# Patient Record
Sex: Female | Born: 1935 | Race: White | Hispanic: No | Smoking: Never smoker
Health system: Southern US, Community
[De-identification: ages and names within clinical notes are randomized; demographics above are authoritative.]

## PROBLEM LIST (undated history)

## (undated) DIAGNOSIS — I639 Cerebral infarction, unspecified: Secondary | ICD-10-CM

## (undated) DIAGNOSIS — E7800 Pure hypercholesterolemia, unspecified: Secondary | ICD-10-CM

## (undated) DIAGNOSIS — I10 Essential (primary) hypertension: Secondary | ICD-10-CM

## (undated) HISTORY — PX: HIP SURGERY: SHX245

## (undated) HISTORY — PX: ABDOMINAL HYSTERECTOMY: SHX81

---

## 2003-03-29 ENCOUNTER — Inpatient Hospital Stay (HOSPITAL_COMMUNITY)
Admission: RE | Admit: 2003-03-29 | Discharge: 2003-04-05 | Payer: Self-pay | Admitting: Physical Medicine & Rehabilitation

## 2015-07-01 ENCOUNTER — Emergency Department: Payer: Medicare Other

## 2015-07-01 ENCOUNTER — Inpatient Hospital Stay
Admission: EM | Admit: 2015-07-01 | Discharge: 2015-07-04 | DRG: 510 | Disposition: A | Discharge status: Skilled Nursing Facility | Payer: Medicare Other | Attending: Internal Medicine | Admitting: Internal Medicine

## 2015-07-01 ENCOUNTER — Encounter: Payer: Self-pay | Admitting: Emergency Medicine

## 2015-07-01 DIAGNOSIS — S52602A Unspecified fracture of lower end of left ulna, initial encounter for closed fracture: Secondary | ICD-10-CM | POA: Diagnosis present

## 2015-07-01 DIAGNOSIS — S72002A Fracture of unspecified part of neck of left femur, initial encounter for closed fracture: Secondary | ICD-10-CM

## 2015-07-01 DIAGNOSIS — E785 Hyperlipidemia, unspecified: Secondary | ICD-10-CM | POA: Diagnosis present

## 2015-07-01 DIAGNOSIS — S72142A Displaced intertrochanteric fracture of left femur, initial encounter for closed fracture: Secondary | ICD-10-CM | POA: Diagnosis present

## 2015-07-01 DIAGNOSIS — S52502A Unspecified fracture of the lower end of left radius, initial encounter for closed fracture: Secondary | ICD-10-CM | POA: Diagnosis present

## 2015-07-01 DIAGNOSIS — W19XXXA Unspecified fall, initial encounter: Secondary | ICD-10-CM | POA: Diagnosis present

## 2015-07-01 DIAGNOSIS — Z9889 Other specified postprocedural states: Secondary | ICD-10-CM

## 2015-07-01 DIAGNOSIS — I10 Essential (primary) hypertension: Secondary | ICD-10-CM | POA: Diagnosis present

## 2015-07-01 DIAGNOSIS — N390 Urinary tract infection, site not specified: Secondary | ICD-10-CM | POA: Diagnosis present

## 2015-07-01 DIAGNOSIS — E7800 Pure hypercholesterolemia, unspecified: Secondary | ICD-10-CM | POA: Diagnosis present

## 2015-07-01 DIAGNOSIS — Z886 Allergy status to analgesic agent status: Secondary | ICD-10-CM

## 2015-07-01 DIAGNOSIS — R52 Pain, unspecified: Secondary | ICD-10-CM | POA: Diagnosis present

## 2015-07-01 DIAGNOSIS — Z66 Do not resuscitate: Secondary | ICD-10-CM | POA: Diagnosis present

## 2015-07-01 DIAGNOSIS — Z7982 Long term (current) use of aspirin: Secondary | ICD-10-CM

## 2015-07-01 DIAGNOSIS — Z79899 Other long term (current) drug therapy: Secondary | ICD-10-CM | POA: Diagnosis not present

## 2015-07-01 DIAGNOSIS — Z809 Family history of malignant neoplasm, unspecified: Secondary | ICD-10-CM

## 2015-07-01 DIAGNOSIS — Z8673 Personal history of transient ischemic attack (TIA), and cerebral infarction without residual deficits: Secondary | ICD-10-CM

## 2015-07-01 DIAGNOSIS — Z9181 History of falling: Secondary | ICD-10-CM | POA: Diagnosis not present

## 2015-07-01 DIAGNOSIS — H409 Unspecified glaucoma: Secondary | ICD-10-CM | POA: Diagnosis present

## 2015-07-01 DIAGNOSIS — B961 Klebsiella pneumoniae [K. pneumoniae] as the cause of diseases classified elsewhere: Secondary | ICD-10-CM | POA: Diagnosis present

## 2015-07-01 DIAGNOSIS — Z90710 Acquired absence of both cervix and uterus: Secondary | ICD-10-CM | POA: Diagnosis not present

## 2015-07-01 DIAGNOSIS — I482 Chronic atrial fibrillation: Secondary | ICD-10-CM | POA: Diagnosis present

## 2015-07-01 DIAGNOSIS — S72009A Fracture of unspecified part of neck of unspecified femur, initial encounter for closed fracture: Secondary | ICD-10-CM | POA: Diagnosis present

## 2015-07-01 HISTORY — DX: Cerebral infarction, unspecified: I63.9

## 2015-07-01 HISTORY — DX: Pure hypercholesterolemia, unspecified: E78.00

## 2015-07-01 HISTORY — DX: Essential (primary) hypertension: I10

## 2015-07-01 LAB — URINALYSIS COMPLETE WITH MICROSCOPIC (ARMC ONLY)
BACTERIA UA: NONE SEEN
BILIRUBIN URINE: NEGATIVE
GLUCOSE, UA: NEGATIVE mg/dL
HGB URINE DIPSTICK: NEGATIVE
LEUKOCYTES UA: NEGATIVE
NITRITE: NEGATIVE
PH: 5 (ref 5.0–8.0)
RBC / HPF: NONE SEEN RBC/hpf (ref 0–5)
SPECIFIC GRAVITY, URINE: 1.02 (ref 1.005–1.030)
Squamous Epithelial / LPF: NONE SEEN
WBC, UA: NONE SEEN WBC/hpf (ref 0–5)

## 2015-07-01 LAB — COMPREHENSIVE METABOLIC PANEL
ALT: 20 U/L (ref 14–54)
AST: 28 U/L (ref 15–41)
Albumin: 3.2 g/dL — ABNORMAL LOW (ref 3.5–5.0)
Alkaline Phosphatase: 68 U/L (ref 38–126)
Anion gap: 8 (ref 5–15)
BILIRUBIN TOTAL: 1 mg/dL (ref 0.3–1.2)
BUN: 29 mg/dL — ABNORMAL HIGH (ref 6–20)
CHLORIDE: 108 mmol/L (ref 101–111)
CO2: 24 mmol/L (ref 22–32)
CREATININE: 1.06 mg/dL — AB (ref 0.44–1.00)
Calcium: 8.8 mg/dL — ABNORMAL LOW (ref 8.9–10.3)
GFR, EST AFRICAN AMERICAN: 52 mL/min — AB (ref >60)
GFR, EST NON AFRICAN AMERICAN: 44 mL/min — AB (ref >60)
Glucose, Bld: 144 mg/dL — ABNORMAL HIGH (ref 65–99)
POTASSIUM: 3.8 mmol/L (ref 3.5–5.1)
Sodium: 140 mmol/L (ref 135–145)
TOTAL PROTEIN: 6.2 g/dL — AB (ref 6.5–8.1)

## 2015-07-01 LAB — CBC WITH DIFFERENTIAL/PLATELET
BASOS ABS: 0 10*3/uL (ref 0–0.1)
BASOS PCT: 0 %
EOS PCT: 0 %
Eosinophils Absolute: 0 10*3/uL (ref 0–0.7)
HCT: 33.2 % — ABNORMAL LOW (ref 35.0–47.0)
Hemoglobin: 11 g/dL — ABNORMAL LOW (ref 12.0–16.0)
LYMPHS PCT: 9 %
Lymphs Abs: 0.7 10*3/uL — ABNORMAL LOW (ref 1.0–3.6)
MCH: 30.2 pg (ref 26.0–34.0)
MCHC: 33 g/dL (ref 32.0–36.0)
MCV: 91.5 fL (ref 80.0–100.0)
MONO ABS: 1 10*3/uL — AB (ref 0.2–0.9)
Monocytes Relative: 13 %
Neutro Abs: 5.9 10*3/uL (ref 1.4–6.5)
Neutrophils Relative %: 78 %
PLATELETS: 135 10*3/uL — AB (ref 150–440)
RBC: 3.63 MIL/uL — AB (ref 3.80–5.20)
RDW: 14.9 % — AB (ref 11.5–14.5)
WBC: 7.6 10*3/uL (ref 3.6–11.0)

## 2015-07-01 LAB — PROTIME-INR
INR: 1.13
Prothrombin Time: 14.7 seconds (ref 11.4–15.0)

## 2015-07-01 LAB — ABO/RH: ABO/RH(D): A POS

## 2015-07-01 LAB — TYPE AND SCREEN
ABO/RH(D): A POS
ANTIBODY SCREEN: NEGATIVE

## 2015-07-01 LAB — TROPONIN I: TROPONIN I: 0.03 ng/mL (ref <0.031)

## 2015-07-01 LAB — TSH: TSH: 3.044 u[IU]/mL (ref 0.350–4.500)

## 2015-07-01 MED ORDER — HYDROCODONE-ACETAMINOPHEN 5-325 MG PO TABS: 1.0000 | ORAL_TABLET | ORAL | Status: DC | PRN

## 2015-07-01 MED ORDER — MORPHINE SULFATE (PF) 2 MG/ML IV SOLN
0.5000 mg | INTRAVENOUS | Status: DC | PRN
  Administered 2015-07-01 – 2015-07-02 (×3): 0.5 mg via INTRAVENOUS
  Filled 2015-07-01 (×3): qty 1

## 2015-07-01 MED ORDER — EZETIMIBE 10 MG PO TABS
10.0000 mg | ORAL_TABLET | Freq: Every day | ORAL | Status: DC
  Administered 2015-07-01: 10 mg via ORAL
  Filled 2015-07-01: qty 1

## 2015-07-01 MED ORDER — ADULT MULTIVITAMIN W/MINERALS CH
1.0000 | ORAL_TABLET | Freq: Every day | ORAL | Status: DC
  Administered 2015-07-01: 1 via ORAL
  Filled 2015-07-01: qty 1

## 2015-07-01 MED ORDER — FERROUS SULFATE 325 (65 FE) MG PO TABS: 325.0000 mg | ORAL_TABLET | Freq: Every day | ORAL | Status: DC

## 2015-07-01 MED ORDER — SODIUM CHLORIDE 0.45 % IV SOLN: INTRAVENOUS | Status: DC

## 2015-07-01 MED ORDER — LISINOPRIL 20 MG PO TABS
20.0000 mg | ORAL_TABLET | Freq: Every day | ORAL | Status: DC
  Administered 2015-07-01: 20 mg via ORAL
  Filled 2015-07-01: qty 1

## 2015-07-01 MED ORDER — SODIUM CHLORIDE 0.9 % IV SOLN
INTRAVENOUS | Status: DC
  Administered 2015-07-01 – 2015-07-02 (×2): via INTRAVENOUS

## 2015-07-01 MED ORDER — HYDRALAZINE HCL 20 MG/ML IJ SOLN
10.0000 mg | INTRAMUSCULAR | Status: DC | PRN
  Administered 2015-07-01: 10 mg via INTRAVENOUS
  Filled 2015-07-01: qty 1

## 2015-07-01 MED ORDER — CEFAZOLIN SODIUM-DEXTROSE 2-3 GM-% IV SOLR
2.0000 g | INTRAVENOUS | Status: DC
  Filled 2015-07-01 (×2): qty 50

## 2015-07-01 MED ORDER — CILOSTAZOL 100 MG PO TABS
100.0000 mg | ORAL_TABLET | Freq: Two times a day (BID) | ORAL | Status: DC
  Administered 2015-07-01: 100 mg via ORAL
  Filled 2015-07-01 (×3): qty 1

## 2015-07-01 MED ORDER — METOPROLOL SUCCINATE ER 25 MG PO TB24
25.0000 mg | ORAL_TABLET | Freq: Every day | ORAL | Status: DC
  Administered 2015-07-01 – 2015-07-02 (×2): 25 mg via ORAL
  Filled 2015-07-01 (×2): qty 1

## 2015-07-01 MED ORDER — DORZOLAMIDE HCL 2 % OP SOLN
1.0000 [drp] | Freq: Two times a day (BID) | OPHTHALMIC | Status: DC
  Administered 2015-07-01 – 2015-07-02 (×2): 1 [drp] via OPHTHALMIC
  Filled 2015-07-01: qty 10

## 2015-07-01 MED ORDER — DOCUSATE SODIUM 100 MG PO CAPS
100.0000 mg | ORAL_CAPSULE | Freq: Two times a day (BID) | ORAL | Status: DC
  Administered 2015-07-01: 100 mg via ORAL
  Filled 2015-07-01: qty 1

## 2015-07-01 MED ORDER — HYDROCODONE-ACETAMINOPHEN 5-325 MG PO TABS
1.0000 | ORAL_TABLET | Freq: Four times a day (QID) | ORAL | Status: DC | PRN
  Administered 2015-07-01 – 2015-07-02 (×2): 2 via ORAL
  Filled 2015-07-01 (×2): qty 2

## 2015-07-01 MED ORDER — SENNOSIDES-DOCUSATE SODIUM 8.6-50 MG PO TABS: 1.0000 | ORAL_TABLET | Freq: Every evening | ORAL | Status: DC | PRN

## 2015-07-01 NOTE — ED Provider Notes (Signed)
[facility] Emergency Department Provider Note  ____________________________________________  Time seen: Approximately 2:39 PM  I have reviewed the triage vital signs and the nursing notes.   HISTORY  Chief Complaint Fall    HPI Cathy Harvey is a 82 y.o. female patient fell and fractured her wrist was seen at another hospital to follow-up with orthopedics but fell again today and landed on the wrist and the left hip and cannot bear weight now. Patient has no other injuries. Pain is mild if she doesn't move she does not need any pain medicine she says at present.   Past Medical History  Diagnosis Date  . Stroke (HCC)   . Hypertension   . Hypercholesteremia     There are no active problems to display for this patient.   Past Surgical History  Procedure Laterality Date  . Abdominal hysterectomy    . Hip surgery      No current outpatient prescriptions on file.  Allergies Codeine  History reviewed. No pertinent family history.  Social History Social History  Substance Use Topics  . Smoking status: Never Smoker   . Smokeless tobacco: None  . Alcohol Use: No    Review of Systems Constitutional: No fever/chills Eyes: No visual changes. ENT: No sore throat. Cardiovascular: Denies chest pain. Respiratory: Denies shortness of breath. Gastrointestinal: No abdominal pain.  No nausea, no vomiting.  No diarrhea.  No constipation. Genitourinary: Negative for dysuria. Musculoskeletal: Negative for back pain. Skin: Negative for rash. Neurological: Negative for headaches, focal weakness or numbness.  10-point ROS otherwise negative.  ____________________________________________   PHYSICAL EXAM:  VITAL SIGNS: ED Triage Vitals  Enc Vitals Group     BP 07/01/15 1244 176/70 mmHg     Pulse Rate 07/01/15 1244 70     Resp 07/01/15 1244 18     Temp --      Temp src --      SpO2 07/01/15 1244 99 %     Weight 07/01/15 1244 125 lb  (56.7 kg)     Height 07/01/15 1244  (1.676 m)     Head Cir --      Peak Flow --      Pain Score 07/01/15 1245 8     Pain Loc --      Pain Edu? --      Excl. in GC? --     Constitutional: Alert and oriented. Well appearing and in no acute distress. Eyes: Conjunctivae are normal. PERRL. EOMI. Head: Atraumatic. Nose: No congestion/rhinnorhea. Mouth/Throat: Mucous membranes are moist.  Oropharynx non-erythematous. Neck: No stridor. Cardiovascular: Normal rate, regular rhythm. Grossly normal heart sounds.  Good peripheral circulation. Respiratory: Normal respiratory effort.  No retractions. Lungs CTAB. Gastrointestinal: Soft and nontender. No distention. No abdominal bruits. No CVA tenderness. Musculoskeletal: No lower extremity tenderness nor edema.  No joint effusions. Neurologic:  Normal speech and language. No gross focal neurologic deficits are appreciated. No gait instability. Skin:  Skin is warm, dry and intact. No rash noted. Psychiatric: Mood and affect are normal. Speech and behavior are normal.  ____________________________________________   LABS (all labs ordered are listed, but only abnormal results are displayed)  Labs Reviewed  COMPREHENSIVE METABOLIC PANEL - Abnormal; Notable for the following:    Glucose, Bld 144 (*)    BUN 29 (*)    Creatinine, Ser 1.06 (*)    Calcium 8.8 (*)    Total Protein 6.2 (*)    Albumin 3.2 (*)    GFR  calc non Af Amer 44 (*)    GFR calc Af Amer 52 (*)    All other components within normal limits  CBC WITH DIFFERENTIAL/PLATELET - Abnormal; Notable for the following:    RBC 3.63 (*)    Hemoglobin 11.0 (*)    HCT 33.2 (*)    RDW 14.9 (*)    Platelets 135 (*)    Lymphs Abs 0.7 (*)    Monocytes Absolute 1.0 (*)    All other components within normal limits  TROPONIN I  TSH   ____________________________________________  EKG   ____________________________________________  RADIOLOGY  Wrist shows a markedly displaced  fracture which is apparently unchanged from previous x-ray MRI of the hip shows a non-displaced into incomplete intertrochanteric fracture ____________________________________________   PROCEDURES    ____________________________________________   INITIAL IMPRESSION / ASSESSMENT AND PLAN / ED COURSE  Pertinent labs & imaging results that were available during my care of the patient were reviewed by me and considered in my medical decision making (see chart for details).  Discussed with Dr. Hyacinth Meeker he will follow-up plan patient for surgery tomorrow ____________________________________________   FINAL CLINICAL IMPRESSION(S) / ED DIAGNOSES  Final diagnoses:  Pain  Hip fracture, left, closed, initial encounter [facility])      Arnaldo Natal, MD 07/01/15 1630

## 2015-07-01 NOTE — ED Notes (Signed)
Patient transported to X-ray 

## 2015-07-01 NOTE — ED Notes (Addendum)
Pt fell and fx left wrist on Monday.  Pt was sitting on stool and slid off today landing on left arm with fx and left hip. C/o soreness to left hip; daughter in law reports unable to bear weight on that leg. Unable to tell if rotation/shortening in wheelchair. Pt denies dizziness; was mechanical fall; reports just slipped off stool. Did not hit head or black out. Left hand has some swelling and redness/purple color; pt reports this is not new; hand is warm to touch.

## 2015-07-01 NOTE — H&P (Signed)
[facility] - [place] [facility]   PATIENT NAME: Cathy Harvey    MR#:  [id number]  DATE OF BIRTH:  10/19/1921  DATE OF ADMISSION:  07/01/2015  PRIMARY CARE PHYSICIAN: [facility]   REQUESTING/REFERRING PHYSICIAN: Dr. Darnelle Catalan  CHIEF COMPLAINT:  Left hip pain status post fall at home today  HISTORY OF PRESENT ILLNESS:  Cathy Harvey  is a 82 y.o. female with a known history of chronic A. fib, hypertension, history of glaucoma comes to the emergency room accompanied by her daughter with a chief complaint. Patient was at a gas station on Thursday had sustained a fall developed left distal radial and ulnar fracture status post cast at [facility] emergency room. Patient went home had a mechanical fall today started having left hip pain and workup in the emergency room including MRI of the left hip shows subacute incomplete left intertrochanteric fracture. Patient is being admitted for further vaginal management Patient has known history of chronic A. fib not on any anticoagulation. No history of coronary artery disease per patient and daughter. Denies any chest pain shortness of breath. She is otherwise quite functional and active. She still drives.  PAST MEDICAL HISTORY:   Past Medical History  Diagnosis Date  . Stroke (HCC)   . Hypertension   . Hypercholesteremia     PAST SURGICAL HISTOIRY:   Past Surgical History  Procedure Laterality Date  . Abdominal hysterectomy    . Hip surgery      SOCIAL HISTORY:   Social History  Substance Use Topics  . Smoking status: Never Smoker   . Smokeless tobacco: Not on file  . Alcohol Use: No    FAMILY HISTORY:   Family History  Problem Relation Age of Onset  . Cancer Other     DRUG ALLERGIES:   Allergies  Allergen Reactions  . Codeine Other (See Comments)    Pt unaware of rxn. Patient is allergic to high doses of codeine.     REVIEW OF SYSTEMS:  Review of Systems   Constitutional: Negative for fever, chills and weight loss.  HENT: Negative for ear discharge, ear pain and nosebleeds.   Eyes: Negative for blurred vision, pain and discharge.  Respiratory: Negative for sputum production, shortness of breath, wheezing and stridor.   Cardiovascular: Negative for chest pain, palpitations, orthopnea and PND.  Gastrointestinal: Negative for nausea, vomiting, abdominal pain and diarrhea.  Genitourinary: Negative for urgency and frequency.  Musculoskeletal: Positive for joint pain and falls. Negative for back pain.  Neurological: Negative for sensory change, speech change, focal weakness and weakness.  Psychiatric/Behavioral: Negative for depression and hallucinations. The patient is not nervous/anxious.   All other systems reviewed and are negative.    MEDICATIONS AT HOME:   Prior to Admission medications   Medication Sig Start Date End Date Taking? Authorizing Provider  aspirin EC 81 MG tablet Take 81 mg by mouth daily.   Yes Historical Provider, MD  cilostazol (PLETAL) 100 MG tablet Take 100 mg by mouth 2 (two) times daily.   Yes Historical Provider, MD  dorzolamide (TRUSOPT) 2 % ophthalmic solution Place 1 drop into both eyes 2 (two) times daily.   Yes Historical Provider, MD  ezetimibe (ZETIA) 10 MG tablet Take 10 mg by mouth daily.   Yes Historical Provider, MD  Ferrous Sulfate (FEROSUL PO) Take 1 tablet by mouth daily.   Yes Historical Provider, MD  ferrous sulfate 325 (65 FE) MG tablet Take 325 mg by mouth daily with breakfast.  Yes Historical Provider, MD  HYDROcodone-acetaminophen (NORCO/VICODIN) 5-325 MG tablet Take 1 tablet by mouth every 4 (four) hours as needed for severe pain.   Yes Historical Provider, MD  lisinopril (PRINIVIL,ZESTRIL) 20 MG tablet Take 20 mg by mouth daily.   Yes Historical Provider, MD  metoprolol succinate (TOPROL-XL) 25 MG 24 hr tablet Take 25 mg by mouth daily.   Yes Historical Provider, MD  Multiple Vitamins-Minerals  (CENTRUM SILVER PO) Take 1 tablet by mouth daily.   Yes Historical Provider, MD      VITAL SIGNS:  Blood pressure 193/77, pulse 87, resp. rate 17, height  (1.676 m), weight 56.7 kg (125 lb), SpO2 100 %.  PHYSICAL EXAMINATION:  GENERAL:  82 y.o.-year-old patient lying in the bed with no acute distress.  EYES: Pupils equal, round, reactive to light and accommodation. No scleral icterus. Extraocular muscles intact.  HEENT: Head atraumatic, normocephalic. Oropharynx and nasopharynx clear.  NECK:  Supple, no jugular venous distention. No thyroid enlargement, no tenderness.  LUNGS: Normal breath sounds bilaterally, no wheezing, rales,rhonchi or crepitation. No use of accessory muscles of respiration.  CARDIOVASCULAR: S1, S2 normal. No murmurs, rubs, or gallops.  ABDOMEN: Soft, nontender, nondistended. Bowel sounds present. No organomegaly or mass.  EXTREMITIES: No pedal edema, cyanosis, or clubbing. Left upper extremity cast. Left lower extremity hip pain and decreased range of motion. NEUROLOGIC: Cranial nerves II through XII are intact. Muscle strength 4/5 in all extremities. Sensation intact. Gait not checked.  PSYCHIATRIC: The patient is alert and oriented x 3.  SKIN: No obvious rash, lesion, or ulcer.   LABORATORY PANEL:   CBC  Recent Labs Lab 07/01/15 1454  WBC 7.6  HGB 11.0*  HCT 33.2*  PLT 135*   ------------------------------------------------------------------------------------------------------------------  Chemistries   Recent Labs Lab 07/01/15 1454  NA 140  K 3.8  CL 108  CO2 24  GLUCOSE 144*  BUN 29*  CREATININE 1.06*  CALCIUM 8.8*  AST 28  ALT 20  ALKPHOS 68  BILITOT 1.0   ------------------------------------------------------------------------------------------------------------------  Cardiac Enzymes  Recent Labs Lab 07/01/15 1454  TROPONINI 0.03    ------------------------------------------------------------------------------------------------------------------  RADIOLOGY:  Dg Wrist Complete Left  07/01/2015  CLINICAL DATA:  Per family pt fell last Friday 2/17, known left wrist fx. Pt fell again today, now c/o left lateral hip pain. EXAM: LEFT WRIST - COMPLETE 3+ VIEW COMPARISON:  06/29/2015 FINDINGS: Wrist is imaged through plaster splint material. There are transverse fractures of the distal radius and ulna associated with 1 shaft width posterior displacement and significant over riding of fracture fragments. Alignment appears unchanged. There is limited detail given the presence of plaster splinting material. IMPRESSION: Unchanged alignment of distal radius and ulnar fractures. Electronically Signed   By: Norva Pavlov M.D.   On: 07/01/2015 14:39   Mr Hip Left Wo Contrast  07/01/2015  CLINICAL DATA:  Status post fall this morning with a left hip injury. Pain. Initial encounter. EXAM: MR OF THE LEFT HIP WITHOUT CONTRAST TECHNIQUE: Multiplanar, multisequence MR imaging was performed. No intravenous contrast was administered. COMPARISON:  Plain films earlier today. FINDINGS: Bones: The patient has an acute or subacute incomplete intertrochanteric fracture. The fracture extends from the greater trochanter into the intertrochanteric femur but does not quite reach the lesser trochanter. No other fracture is identified. Artifact from right hip replacement is noted. A well-circumscribed cystic lesion in the proximal diaphysis of the femur measuring 0.6 cm has benign features. Articular cartilage and labrum Articular cartilage:  Mildly degenerated. Labrum:  Mildly  degenerated. Joint or bursal effusion Joint effusion:  No effusion. Bursae:  Unremarkable. Muscles and tendons Muscles and tendons: There is fluid is seen about the left gluteus medius attending compatible with strain. Short adductors also demonstrates some intrasubstance edema consistent  with strain. No frank tear is identified. Other findings Miscellaneous:  None. IMPRESSION: The study is positive for an acute for subacute incomplete left intertrochanteric fracture. Strain of the short adductors about the left hip and left gluteus medius without tear also noted. Electronically Signed   By: Drusilla Kanner M.D.   On: 07/01/2015 15:58   Dg Hip Unilat With Pelvis 2-3 Views Left  07/01/2015  CLINICAL DATA:  Fall with left hip pain. EXAM: DG HIP (WITH OR WITHOUT PELVIS) 2-3V LEFT COMPARISON:  12/11/2007 CT abdomen/ pelvis. FINDINGS: No fracture, dislocation or suspicious focal osseous lesion in the left hip. Partially visualized right hip hemiarthroplasty, with no evidence of hardware fracture or loosening. Degenerative changes in the visualized lower lumbar spine. Headache curvilinear calcifications in the left hip joint may indicate chondrocalcinosis. IMPRESSION: No fracture or malalignment in the left hip. Electronically Signed   By: Delbert Phenix M.D.   On: 07/01/2015 14:37    EKG:   A. fib st elevation or depression. No old EKG for comparison IMPRESSION AND PLAN:   Vena Bassinger  is a 82 y.o. female with a known history of chronic A. fib, hypertension, history of glaucoma comes to the emergency room accompanied by her daughter with a chief complaint. Patient was at a gas station on Thursday had sustained a fall developed left distal radial and ulnar fracture status post cast at [facility] emergency room. Patient went home had a mechanical fall today started having left hip pain and workup in the emergency room including MRI of the left hip shows subacute incomplete left intertrochanteric fracture.  1. Acute left intertrochanteric fracture status post mechanical fall -Admit to ortho floor -IV fluids -Orthopedic consultation -Patient is an intermediate risk for surgery. Okay to proceed -Continue beat blockers in the perioperative period -Resume aspirin after surgery -When  necessary pain meds -Incentive spirometer  2. DVT prophylaxis per Dr. Hyacinth Meeker SCDs and teds for now  3. Chronic A. fib heart rate stable -Continue metoprolol -Resume aspirin after surgery  4. Hyperlipidemia continue statins  5. Left distal ulnar and radial fracture per Dr. Hyacinth Meeker  Above was discussed with patient and patient's daughter present in the emergency room  All the records are reviewed and case discussed with ED provider. Management plans discussed with the patient, family and they are in agreement.  CODE STATUS: DO NOT RESUSCITATE. This was discussed with patient's daughter and patient  TOTAL TIME TAKING CARE OF THIS PATIENT: 50 minutes.    Efrat Zuidema M.D on 07/01/2015 at 5:20 PM  Between 7am to 6pm - Pager - [phone]  After 6pm go to [email or web address] - password [email or web address] [facility]  [facility]  Office  [phone]  CC: Primary care physician; [facility]

## 2015-07-01 NOTE — ED Notes (Signed)
Dr Darnelle Catalan notified pt in new onset afib on monitor. Will obtain labs and iv before pt goes to mri.

## 2015-07-02 ENCOUNTER — Inpatient Hospital Stay: Payer: Medicare Other | Admitting: Anesthesiology

## 2015-07-02 ENCOUNTER — Encounter: Payer: Self-pay | Admitting: Anesthesiology

## 2015-07-02 ENCOUNTER — Encounter
Admission: EM | Disposition: A | Discharge status: Skilled Nursing Facility | Payer: Self-pay | Source: Home / Self Care | Attending: Internal Medicine

## 2015-07-02 HISTORY — PX: OPEN REDUCTION INTERNAL FIXATION (ORIF) DISTAL RADIAL FRACTURE: SHX5989

## 2015-07-02 LAB — SURGICAL PCR SCREEN
MRSA, PCR: NEGATIVE
Staphylococcus aureus: NEGATIVE

## 2015-07-02 SURGERY — OPEN REDUCTION INTERNAL FIXATION (ORIF) DISTAL RADIUS FRACTURE
Anesthesia: General | Laterality: Left

## 2015-07-02 MED ORDER — SODIUM CHLORIDE 0.45 % IV SOLN
INTRAVENOUS | Status: DC
  Administered 2015-07-02 – 2015-07-03 (×2): via INTRAVENOUS

## 2015-07-02 MED ORDER — ONDANSETRON HCL 4 MG/2ML IJ SOLN: 4.0000 mg | Freq: Four times a day (QID) | INTRAMUSCULAR | Status: DC | PRN

## 2015-07-02 MED ORDER — METHOCARBAMOL 1000 MG/10ML IJ SOLN
500.0000 mg | Freq: Four times a day (QID) | INTRAVENOUS | Status: DC | PRN
  Filled 2015-07-02: qty 5

## 2015-07-02 MED ORDER — ALPRAZOLAM 0.5 MG PO TABS: 0.5000 mg | ORAL_TABLET | Freq: Four times a day (QID) | ORAL | Status: DC | PRN

## 2015-07-02 MED ORDER — LACTATED RINGERS IV SOLN
INTRAVENOUS | Status: DC
  Administered 2015-07-02 (×2): via INTRAVENOUS

## 2015-07-02 MED ORDER — ZOLPIDEM TARTRATE 5 MG PO TABS
5.0000 mg | ORAL_TABLET | Freq: Every evening | ORAL | Status: DC | PRN
Start: 2015-07-02 — End: 2015-07-04

## 2015-07-02 MED ORDER — NEOMYCIN-POLYMYXIN B GU 40-200000 IR SOLN
Status: AC
  Filled 2015-07-02: qty 2

## 2015-07-02 MED ORDER — ONDANSETRON HCL 4 MG/2ML IJ SOLN
INTRAMUSCULAR | Status: DC | PRN
  Administered 2015-07-02: 4 mg via INTRAVENOUS

## 2015-07-02 MED ORDER — HYDROCODONE-ACETAMINOPHEN 5-325 MG PO TABS: 1.0000 | ORAL_TABLET | ORAL | Status: DC | PRN

## 2015-07-02 MED ORDER — FENTANYL CITRATE (PF) 100 MCG/2ML IJ SOLN
25.0000 ug | INTRAMUSCULAR | Status: DC | PRN
  Administered 2015-07-02: 25 ug via INTRAVENOUS

## 2015-07-02 MED ORDER — FENTANYL CITRATE (PF) 100 MCG/2ML IJ SOLN
INTRAMUSCULAR | Status: DC | PRN
  Administered 2015-07-02: 25 ug via INTRAVENOUS
  Administered 2015-07-02: 50 ug via INTRAVENOUS
  Administered 2015-07-02: 25 ug via INTRAVENOUS

## 2015-07-02 MED ORDER — METHOCARBAMOL 500 MG PO TABS: 500.0000 mg | ORAL_TABLET | Freq: Four times a day (QID) | ORAL | Status: DC | PRN

## 2015-07-02 MED ORDER — ONDANSETRON HCL 4 MG PO TABS: 4.0000 mg | ORAL_TABLET | Freq: Four times a day (QID) | ORAL | Status: DC | PRN

## 2015-07-02 MED ORDER — NEOMYCIN-POLYMYXIN B GU 40-200000 IR SOLN
Status: DC | PRN
  Administered 2015-07-02: 2 mL

## 2015-07-02 MED ORDER — MORPHINE SULFATE (PF) 2 MG/ML IV SOLN
0.5000 mg | INTRAVENOUS | Status: DC | PRN
  Administered 2015-07-02: 0.5 mg via INTRAVENOUS
  Filled 2015-07-02: qty 1

## 2015-07-02 MED ORDER — ASPIRIN EC 325 MG PO TBEC
325.0000 mg | DELAYED_RELEASE_TABLET | Freq: Two times a day (BID) | ORAL | Status: DC
Start: 2015-07-02 — End: 2015-07-04
  Administered 2015-07-02 – 2015-07-04 (×4): 325 mg via ORAL
  Filled 2015-07-02 (×4): qty 1

## 2015-07-02 MED ORDER — DEXAMETHASONE SODIUM PHOSPHATE 10 MG/ML IJ SOLN
INTRAMUSCULAR | Status: DC | PRN
  Administered 2015-07-02: 6 mg via INTRAVENOUS

## 2015-07-02 MED ORDER — BUPIVACAINE HCL (PF) 0.5 % IJ SOLN
INTRAMUSCULAR | Status: AC
  Filled 2015-07-02: qty 30

## 2015-07-02 MED ORDER — FENTANYL CITRATE (PF) 100 MCG/2ML IJ SOLN
INTRAMUSCULAR | Status: AC
  Administered 2015-07-02: 25 ug via INTRAVENOUS
  Filled 2015-07-02: qty 2

## 2015-07-02 MED ORDER — FAMOTIDINE 20 MG PO TABS: 20.0000 mg | ORAL_TABLET | Freq: Two times a day (BID) | ORAL | Status: DC | PRN

## 2015-07-02 SURGICAL SUPPLY — 32 items
BLADE SURG MINI STRL (BLADE) ×2 IMPLANT
BNDG COHESIVE 4X5 TAN STRL (GAUZE/BANDAGES/DRESSINGS) IMPLANT
BNDG ESMARK 4X12 TAN STRL LF (GAUZE/BANDAGES/DRESSINGS) ×2 IMPLANT
CANISTER SUCT 1200ML W/VALVE (MISCELLANEOUS) ×2 IMPLANT
CHLORAPREP W/TINT 26ML (MISCELLANEOUS) ×2 IMPLANT
DRAPE FLUOR MINI C-ARM 54X84 (DRAPES) ×2 IMPLANT
ELECT REM PT RETURN 9FT ADLT (ELECTROSURGICAL) ×2
ELECTRODE REM PT RTRN 9FT ADLT (ELECTROSURGICAL) ×1 IMPLANT
FIBERGLASS CAST TAPE 3IN ×2 IMPLANT
GAUZE FLUFF 18X24 1PLY STRL (GAUZE/BANDAGES/DRESSINGS) ×2 IMPLANT
GAUZE PETRO XEROFOAM 1X8 (MISCELLANEOUS) ×2 IMPLANT
GAUZE SPONGE 4X4 12PLY STRL (GAUZE/BANDAGES/DRESSINGS) ×2 IMPLANT
GLOVE INDICATOR 8.0 STRL GRN (GLOVE) ×2 IMPLANT
GLOVE SURG ORTHO 8.5 STRL (GLOVE) ×2 IMPLANT
GOWN STRL REUS W/ TWL LRG LVL3 (GOWN DISPOSABLE) ×2 IMPLANT
GOWN STRL REUS W/TWL LRG LVL3 (GOWN DISPOSABLE) ×2
KIT RM TURNOVER STRD PROC AR (KITS) ×2 IMPLANT
NDL SAFETY 18GX1.5 (NEEDLE) ×2 IMPLANT
NS IRRIG 500ML POUR BTL (IV SOLUTION) ×2 IMPLANT
PACK EXTREMITY ARMC (MISCELLANEOUS) ×2 IMPLANT
PADDING CAST 3IN STRL (MISCELLANEOUS) ×2
PADDING CAST 4IN STRL (MISCELLANEOUS)
PADDING CAST BLEND 3X4 STRL (MISCELLANEOUS) ×2 IMPLANT
PADDING CAST BLEND 4X4 STRL (MISCELLANEOUS) IMPLANT
SPLINT CAST 1 STEP 3X12 (MISCELLANEOUS) IMPLANT
SPONGE LAP 18X18 5 PK (GAUZE/BANDAGES/DRESSINGS) ×2 IMPLANT
STAPLER SKIN PROX 35W (STAPLE) ×2 IMPLANT
STOCKINETTE BIAS CUT 4 980044 (GAUZE/BANDAGES/DRESSINGS) ×2 IMPLANT
STOCKINETTE STRL 4IN 9604848 (GAUZE/BANDAGES/DRESSINGS) ×2 IMPLANT
SUT VIC AB 3-0 SH 27 (SUTURE) ×1
SUT VIC AB 3-0 SH 27X BRD (SUTURE) ×1 IMPLANT
WIRE Z .062 C-WIRE SPADE TIP (WIRE) ×4 IMPLANT

## 2015-07-02 NOTE — Care Management Note (Addendum)
Case Management Note  Patient Details  Name: Cathy Harvey MRN: [id number] Date of Birth: [birth year]  Subjective/Objective:      82yo [name] was admitted 07/01/15 after a second fall which resulted in an incomplete left hip fracture. On 06/28/15 [name] fell and fractured her left distal radial and left ulnar and was seen in another ED. [name] is currently NPO pending surgery by Dr Hyacinth Meeker today. Hx: A-Feb, HTN, glaucoma. Drives herself to appointments but family will drive her after this discharge. Resides alone. PCP=[facility]. Pharmacy=[facility] in [place]. No home assistive equipment except a cane. No home oxygen, no current home health services. [name] reports that she wants to go to rehab at [facility] after this hospital discharge.  [facility] is following for placement after discharge.             Action/Plan:   Expected Discharge Date:  07/05/15               Expected Discharge Plan:     In-House Referral:     Discharge planning Services     Post Acute Care Choice:    Choice offered to:     DME Arranged:    DME Agency:     HH Arranged:    HH Agency:     Status of Service:     Medicare Important Message Given:  Yes Date Medicare IM Given:    Medicare IM give by:    Date Additional Medicare IM Given:    Additional Medicare Important Message give by:     If discussed at Long Length of Stay Meetings, dates discussed:    Additional Comments:  Willys Salvino A, RN 07/02/2015, 11:44 AM

## 2015-07-02 NOTE — Transfer of Care (Signed)
Immediate Anesthesia Transfer of Care Note  Patient: Cathy Harvey  Procedure(s) Performed: Procedure(s): CLOSED REDUCTION LEFT DISTAL RADIAL FRACTURE WITH PINNING (Left)  Patient Location: PACU  Anesthesia Type:General  Level of Consciousness: sedated  Airway & Oxygen Therapy: Patient Spontanous Breathing and Patient connected to face mask oxygen  Post-op Assessment: Report given to RN and Post -op Vital signs reviewed and stable  Post vital signs: Reviewed and stable  Last Vitals:  Filed Vitals:   07/02/15 0835 07/02/15 1411  BP: 159/54 196/83  Pulse: 61 52  Temp:  36.8 C  Resp:  16    Complications: No apparent anesthesia complications

## 2015-07-02 NOTE — NC FL2 (Signed)
[place] MEDICAID FL2 LEVEL OF CARE SCREENING TOOL     IDENTIFICATION  Patient Name: Cathy Harvey Birthdate: 14-May-1921 Sex: female Admission Date (Current Location): 07/01/2015  [place] and [place] Number:  Geophysical data processor and Address:  [facility], [address] [place], [place] [address]      Provider Number: [id number]  Attending Physician Name and Address:  Enedina Finner, MD  Relative Name and Phone Number:       Current Level of Care: Hospital Recommended Level of Care: Skilled Nursing Facility Prior Approval Number:    Date Approved/Denied:   PASRR Number:  [id number])  Discharge Plan: SNF    Current Diagnoses: Patient Active Problem List   Diagnosis Date Noted  . Hip fracture (HCC) 07/01/2015   . Stroke (HCC)   . Hypertension   . Hypercholesteremia          Orientation RESPIRATION BLADDER Height & Weight     Self, Time, Situation, Place  Normal Continent Weight: 122 lb 9.6 oz (55.611 kg) Height:   (167.6 cm)  BEHAVIORAL SYMPTOMS/MOOD NEUROLOGICAL BOWEL NUTRITION STATUS   (none )  (none ) Continent Diet (NPO for surgery )  AMBULATORY STATUS COMMUNICATION OF NEEDS Skin   Extensive Assist Verbally Surgical wounds (Incision: Left Hip. )                       Personal Care Assistance Level of Assistance  Bathing, Feeding, Dressing Bathing Assistance: Limited assistance Feeding assistance: Independent Dressing Assistance: Limited assistance     Functional Limitations Info  Sight, Hearing, Speech Sight Info: Adequate Hearing Info: Adequate Speech Info: Adequate    SPECIAL CARE FACTORS FREQUENCY  PT (By licensed PT), OT (By licensed OT)     PT Frequency:  (5) OT Frequency:  (5)            Contractures      Additional Factors Info  Code Status, Allergies Code Status Info:  (DNR ) Allergies Info:  (Codeine)           Current Medications (07/02/2015):  This is the current  hospital active medication list Current Facility-Administered Medications  Medication Dose Route Frequency Provider Last Rate Last Dose  . 0.45 % sodium chloride infusion   Intravenous Continuous Deeann Saint, MD      . 0.9 %  sodium chloride infusion   Intravenous Continuous Enedina Finner, MD 75 mL/hr at 07/02/15 0521    . ceFAZolin (ANCEF) IVPB 2 g/50 mL premix  2 g Intravenous On Call to OR Deeann Saint, MD      . cilostazol (PLETAL) tablet 100 mg  100 mg Oral BID Enedina Finner, MD   100 mg at 07/01/15 2057  . docusate sodium (COLACE) capsule 100 mg  100 mg Oral BID Enedina Finner, MD   100 mg at 07/01/15 2058  . dorzolamide (TRUSOPT) 2 % ophthalmic solution 1 drop  1 drop Both Eyes BID Enedina Finner, MD   1 drop at 07/01/15 2057  . ezetimibe (ZETIA) tablet 10 mg  10 mg Oral Daily Enedina Finner, MD   10 mg at 07/01/15 2057  . ferrous sulfate tablet 325 mg  325 mg Oral Q breakfast Enedina Finner, MD      . hydrALAZINE (APRESOLINE) injection 10 mg  10 mg Intravenous Q4H PRN Marguarite Arbour, MD   10 mg at 07/01/15 1927  . HYDROcodone-acetaminophen (NORCO/VICODIN) 5-325 MG per tablet 1 tablet  1 tablet Oral Q4H  PRN Enedina Finner, MD      . HYDROcodone-acetaminophen (NORCO/VICODIN) 5-325 MG per tablet 1-2 tablet  1-2 tablet Oral Q6H PRN Enedina Finner, MD   2 tablet at 07/02/15 0527  . lisinopril (PRINIVIL,ZESTRIL) tablet 20 mg  20 mg Oral Daily Enedina Finner, MD   20 mg at 07/01/15 2058  . metoprolol succinate (TOPROL-XL) 24 hr tablet 25 mg  25 mg Oral Daily Enedina Finner, MD   25 mg at 07/01/15 2058  . morphine 2 MG/ML injection 0.5 mg  0.5 mg Intravenous Q2H PRN Enedina Finner, MD   0.5 mg at 07/02/15 0409  . multivitamin with minerals tablet 1 tablet  1 tablet Oral Daily Enedina Finner, MD   1 tablet at 07/01/15 2058  . senna-docusate (Senokot-S) tablet 1 tablet  1 tablet Oral QHS PRN Enedina Finner, MD         Discharge Medications: Please see discharge summary for a list of discharge medications.  Relevant Imaging  Results:  Relevant Lab Results:   Additional Information  (SSN: [id number])  Haig Prophet, LCSW

## 2015-07-02 NOTE — Progress Notes (Signed)
Clinical Child psychotherapist (CSW) presented bed offers to patient's daughter in Social worker [place]. Angelique Blonder chose [facility]	[place]. Kim admissions coordinator at [facility] is aware of accepted bed offer. CSW will continue to follow and assist as needed.   Jetta Lout, LCSW [phone]

## 2015-07-02 NOTE — Clinical Social Work Note (Signed)
Clinical Social Work Assessment  Patient Details  Name: Cathy Harvey MRN: [id number] Date of Birth: Mar 17, 1922  Date of referral:  07/02/15               Reason for consult:  Facility Placement                Permission sought to share information with:  Facility Sport and exercise psychologist, Family Supports Permission granted to share information::  Yes, Verbal Permission Granted  Name::      [place]::   [place]   Relationship::     Contact Information:     Housing/Transportation Living arrangements for the past 2 months:  [place] of Information:  Patient, Power of Attorney Patient Interpreter Needed:  None Criminal Activity/Legal Involvement Pertinent to Current Situation/Hospitalization:  No - Comment as needed Significant Relationships:  Other Family Members, Adult Children Lives with:  Self Do you feel safe going back to the place where you live?  Yes Need for family participation in patient care:  Yes (Comment)  Care giving concerns:  Patient lives alone in [place].    Social Worker assessment / plan:  Holiday representative (CSW) received SNF consult. Patient is schedueld to have surgery today for hip fracture. CSW met with patient this morning prior to surgery. Patient was alert and oriented and laying in the bed. CSW introduced self and explained role of CSW department. Patient reported that she lives alone in [place]. Patient reported that she fell while filling up her car with gas at a gas station. Patient reported that she was at [facility] for a few hours and her daughter in law Langley Gauss and son Pilar Plate decided to bring her to [place] where they live. CSW explained that PT will work with patient after surgery and recommend home health or SNF. Patient reported that her daughter in law Langley Gauss will make the decisions regarding her D/C plan. Patient gave CSW permission to call [facility]. CSW contacted Denise [phone]. Per  Langley Gauss she is agreeable to SNF search in [facility] and prefers [facility] Langley Gauss reported that her father Netta Cedars is a long term care resident at [facility]. CSW explained to [facility] that patient will need a 3 night qualifying inpatient stay in order for Medicare to pay for SNF. Langley Gauss verbalized her understating.   FL2 complete and faxed out.    Employment status:  Retired Forensic scientist:  Medicare PT Recommendations:  Not assessed at this time [place] / Referral to community resources:  [place]  Patient/Family's Response to care:  Patient's daughter in law Langley Gauss is agreeable to SNF search in [place].   Patient/Family's Understanding of and Emotional Response to Diagnosis, Current Treatment, and Prognosis:  Patient and Langley Gauss were pleasant throughout assessment.   Emotional Assessment Appearance:  Appears stated age Attitude/Demeanor/Rapport:    Affect (typically observed):  Accepting, Adaptable, Pleasant Orientation:  Oriented to Self, Oriented to Place, Oriented to  Time, Oriented to Situation Alcohol / Substance use:  Not Applicable Psych involvement (Current and /or in the community):  No (Comment)  Discharge Needs  Concerns to be addressed:  Discharge Planning Concerns Readmission within the last 30 days:  No Current discharge risk:  Dependent with Mobility Barriers to Discharge:  Continued Medical Work up   Loralyn Freshwater, LCSW 07/02/2015, 8:45 AM

## 2015-07-02 NOTE — Anesthesia Preprocedure Evaluation (Signed)
Anesthesia Evaluation  Patient identified by MRN, date of birth, ID band Patient awake    Reviewed: Allergy & Precautions, H&P , NPO status , Patient's Chart, lab work & pertinent test results  History of Anesthesia Complications Negative for: history of anesthetic complications  Airway Mallampati: III  TM Distance: >3 FB Neck ROM: full    Dental  (+) Poor Dentition, Chipped, Caps, Implants   Pulmonary neg pulmonary ROS, neg shortness of breath,    Pulmonary exam normal breath sounds clear to auscultation       Cardiovascular Exercise Tolerance: Good hypertension, (-) Past MI and (-) DOE Normal cardiovascular exam+ dysrhythmias Atrial Fibrillation  Rhythm:regular Rate:Normal     Neuro/Psych CVA, Residual Symptoms negative psych ROS   GI/Hepatic negative GI ROS, Neg liver ROS,   Endo/Other  negative endocrine ROS  Renal/GU negative Renal ROS  negative genitourinary   Musculoskeletal   Abdominal   Peds  Hematology negative hematology ROS (+)   Anesthesia Other Findings Past Medical History:   Stroke (HCC)                                                 Hypertension                                                 Hypercholesteremia                                          Past Surgical History:   ABDOMINAL HYSTERECTOMY                                        HIP SURGERY                                                  BMI    Body Mass Index   19.87 kg/m 2      Reproductive/Obstetrics negative OB ROS                             Anesthesia Physical Anesthesia Plan  ASA: III  Anesthesia Plan: General LMA   Post-op Pain Management:    Induction:   Airway Management Planned:   Additional Equipment:   Intra-op Plan:   Post-operative Plan:   Informed Consent: I have reviewed the patients History and Physical, chart, labs and discussed the procedure including the risks,  benefits and alternatives for the proposed anesthesia with the patient or authorized representative who has indicated his/her understanding and acceptance.   Dental Advisory Given  Plan Discussed with: Anesthesiologist, CRNA and Surgeon  Anesthesia Plan Comments:         Anesthesia Quick Evaluation

## 2015-07-02 NOTE — Care Management Important Message (Signed)
Important Message  Patient Details  Name: ANHELICA FOWERS MRN: [id number] Date of Birth: 1921/09/12   Medicare Important Message Given:  Yes    Giancarlo Askren A, RN 07/02/2015, 8:12 AM

## 2015-07-02 NOTE — Clinical Social Work Placement (Signed)
   CLINICAL SOCIAL WORK PLACEMENT  NOTE  Date:  07/02/2015  Patient Details  Name: Cathy Harvey MRN: [id number] Date of Birth: 18-Jan-1922  Clinical Social Work is seeking post-discharge placement for this patient at the Skilled  Nursing Facility level of care (*CSW will initial, date and re-position this form in  chart as items are completed):  Yes   Patient/family provided with [facility] Clinical Social Work Department's list of facilities offering this level of care within the geographic area requested by the patient (or if unable, by the patient's family).  Yes   Patient/family informed of their freedom to choose among providers that offer the needed level of care, that participate in Medicare, Medicaid or managed care program needed by the patient, have an available bed and are willing to accept the patient.  Yes   Patient/family informed of 's ownership interest in [facility] and [facility], as well as of the fact that they are under no obligation to receive care at these facilities.  PASRR submitted to EDS on 07/02/15     PASRR number received on 07/02/15     Existing PASRR number confirmed on       FL2 transmitted to all facilities in geographic area requested by pt/family on 07/02/15     FL2 transmitted to all facilities within larger geographic area on       Patient informed that his/her managed care company has contracts with or will negotiate with certain facilities, including the following:            Patient/family informed of bed offers received.  Patient chooses bed at       Physician recommends and patient chooses bed at      Patient to be transferred to   on  .  Patient to be transferred to facility by       Patient family notified on   of transfer.  Name of family member notified:        PHYSICIAN       Additional Comment:    _______________________________________________ Haig Prophet, LCSW 07/02/2015, 8:44  AM

## 2015-07-02 NOTE — H&P (Signed)
THE PATIENT WAS SEEN PRIOR TO SURGERY TODAY.  HISTORY, ALLERGIES, HOME MEDICATIONS AND OPERATIVE PROCEDURE WERE REVIEWED. RISKS AND BENEFITS OF SURGERY DISCUSSED WITH PATIENT AGAIN.  NO CHANGES FROM INITIAL HISTORY AND PHYSICAL NOTED.    

## 2015-07-02 NOTE — Anesthesia Postprocedure Evaluation (Signed)
Anesthesia Post Note  Patient: Cathy Harvey [facility]  Procedure(s) Performed: Procedure(s) (LRB): CLOSED REDUCTION LEFT DISTAL RADIAL FRACTURE WITH PINNING (Left)  Patient location during evaluation: PACU Anesthesia Type: General Level of consciousness: awake and alert Pain management: pain level controlled Vital Signs Assessment: post-procedure vital signs reviewed and stable Respiratory status: spontaneous breathing and respiratory function stable Cardiovascular status: blood pressure returned to baseline and stable Anesthetic complications: no    Last Vitals:  Filed Vitals:   07/02/15 1622 07/02/15 1643  BP: 135/75 208/73  Pulse: 118 35  Temp:  36.9 C  Resp: 15 20    Last Pain:  Filed Vitals:   07/02/15 1645  PainSc: 2                  Younis Mathey K

## 2015-07-02 NOTE — Op Note (Signed)
       07/01/2015 - 07/02/2015  3:59 PM  PATIENT:  Cathy Harvey    PRE-OPERATIVE DIAGNOSIS:  Left wrist fracture distal radius and ulna, displaced  POST-OPERATIVE DIAGNOSIS:  Same  PROCEDURE:  CLOSED REDUCTION LEFT DISTAL RADIAL FRACTURE WITH PINNING  SURGEON:  Valinda Hoar, MD  ANESTHESIA:   General  PREOPERATIVE INDICATIONS:  Cathy Harvey is a  82 y.o. female with a diagnosis of Left wrist fracture who failed conservative measures and elected for surgical management.    The risks benefits and alternatives were discussed with the patient preoperatively including but not limited to the risks of infection, bleeding, nerve injury, cardiopulmonary complications, the need for revision surgery, among others, and the patient was willing to proceed.  YNW:[id number]  TOURNIQUET TIME: None  OPERATIVE IMPLANTS: 0.62C wires  OPERATIVE FINDINGS: Displaced unstable left distal radius and ulna fractures  OPERATIVE PROCEDURE: The patient brought to the operating room and underwent general LMA anesthesia in the supine position. The left arm was prepped and draped in a sterile fashion. Closed reduction was carried out on the distal radius and ulna. Fluoroscopy showed good positioning. Both fractures were then pinned with 0.62C wires. Final fluoroscopy showed good fracture and pins to be in good position. The pins were bent and buried. Sterile dressings and a well-padded short arm cast was applied. The patient was awakened and taken to recovery in good condition.  Valinda Hoar, MD

## 2015-07-02 NOTE — Progress Notes (Signed)
[facility] - [place] at [facility]   PATIENT NAME: Cathy Harvey    MR#:  [id number]  DATE OF BIRTH:  11/21/1921  SUBJECTIVE:  Resting quietly. No issues per RN  REVIEW OF SYSTEMS:   Review of Systems  Constitutional: Negative for fever, chills and weight loss.  HENT: Negative for ear discharge, ear pain and nosebleeds.   Eyes: Negative for blurred vision, pain and discharge.  Respiratory: Negative for sputum production, shortness of breath, wheezing and stridor.   Cardiovascular: Negative for chest pain, palpitations, orthopnea and PND.  Gastrointestinal: Negative for nausea, vomiting, abdominal pain and diarrhea.  Genitourinary: Negative for urgency and frequency.  Musculoskeletal: Positive for joint pain. Negative for back pain.  Neurological: Negative for sensory change, speech change, focal weakness and weakness.  Psychiatric/Behavioral: Negative for depression and hallucinations. The patient is not nervous/anxious.   All other systems reviewed and are negative.  Tolerating Diet:npo for surgery Tolerating PT: pending  DRUG ALLERGIES:   Allergies  Allergen Reactions  . Codeine Other (See Comments)    Pt unaware of rxn. Patient is allergic to high doses of codeine.     VITALS:  Blood pressure 159/54, pulse 61, temperature 97.5 F (36.4 C), temperature source Oral, resp. rate 16, height 5\' 6"  (1.676 m), weight 55.611 kg (122 lb 9.6 oz), SpO2 100 %.  PHYSICAL EXAMINATION:   Physical Exam  GENERAL:  82 y.o.-year-old patient lying in the bed with no acute distress.  EYES: Pupils equal, round, reactive to light and accommodation. No scleral icterus. Extraocular muscles intact.  HEENT: Head atraumatic, normocephalic. Oropharynx and nasopharynx clear.  NECK:  Supple, no jugular venous distention. No thyroid enlargement, no tenderness.  LUNGS: Normal breath sounds bilaterally, no wheezing, rales, rhonchi. No use of accessory muscles of  respiration.  CARDIOVASCULAR: S1, S2 normal. No murmurs, rubs, or gallops.  ABDOMEN: Soft, nontender, nondistended. Bowel sounds present. No organomegaly or mass.  EXTREMITIES: No cyanosis, clubbing or edema b/l.   Left UE cast+ NEUROLOGIC: Cranial nerves II through XII are intact. No focal Motor or sensory deficits b/l.   PSYCHIATRIC:  patient is alert and oriented x 3.  SKIN: No obvious rash, lesion, or ulcer.   LABORATORY PANEL:  CBC  Recent Labs Lab 07/01/15 1454  WBC 7.6  HGB 11.0*  HCT 33.2*  PLT 135*    Chemistries   Recent Labs Lab 07/01/15 1454  NA 140  K 3.8  CL 108  CO2 24  GLUCOSE 144*  BUN 29*  CREATININE 1.06*  CALCIUM 8.8*  AST 28  ALT 20  ALKPHOS 68  BILITOT 1.0   Cardiac Enzymes  Recent Labs Lab 07/01/15 1454  TROPONINI 0.03   RADIOLOGY:  Dg Wrist Complete Left  07/01/2015  CLINICAL DATA:  Per family pt fell last Friday 2/17, known left wrist fx. Pt fell again today, now c/o left lateral hip pain. EXAM: LEFT WRIST - COMPLETE 3+ VIEW COMPARISON:  06/29/2015 FINDINGS: Wrist is imaged through plaster splint material. There are transverse fractures of the distal radius and ulna associated with 1 shaft width posterior displacement and significant over riding of fracture fragments. Alignment appears unchanged. There is limited detail given the presence of plaster splinting material. IMPRESSION: Unchanged alignment of distal radius and ulnar fractures. Electronically Signed   By: Norva Pavlov M.D.   On: 07/01/2015 14:39   Mr Hip Left Wo Contrast  07/01/2015  CLINICAL DATA:  Status post fall this morning with a left hip injury. Pain.  Initial encounter. EXAM: MR OF THE LEFT HIP WITHOUT CONTRAST TECHNIQUE: Multiplanar, multisequence MR imaging was performed. No intravenous contrast was administered. COMPARISON:  Plain films earlier today. FINDINGS: Bones: The patient has an acute or subacute incomplete intertrochanteric fracture. The fracture extends from  the greater trochanter into the intertrochanteric femur but does not quite reach the lesser trochanter. No other fracture is identified. Artifact from right hip replacement is noted. A well-circumscribed cystic lesion in the proximal diaphysis of the femur measuring 0.6 cm has benign features. Articular cartilage and labrum Articular cartilage:  Mildly degenerated. Labrum:  Mildly degenerated. Joint or bursal effusion Joint effusion:  No effusion. Bursae:  Unremarkable. Muscles and tendons Muscles and tendons: There is fluid is seen about the left gluteus medius attending compatible with strain. Short adductors also demonstrates some intrasubstance edema consistent with strain. No frank tear is identified. Other findings Miscellaneous:  None. IMPRESSION: The study is positive for an acute for subacute incomplete left intertrochanteric fracture. Strain of the short adductors about the left hip and left gluteus medius without tear also noted. Electronically Signed   By: Drusilla Kanner M.D.   On: 07/01/2015 15:58   Dg Hip Unilat With Pelvis 2-3 Views Left  07/01/2015  CLINICAL DATA:  Fall with left hip pain. EXAM: DG HIP (WITH OR WITHOUT PELVIS) 2-3V LEFT COMPARISON:  12/11/2007 CT abdomen/ pelvis. FINDINGS: No fracture, dislocation or suspicious focal osseous lesion in the left hip. Partially visualized right hip hemiarthroplasty, with no evidence of hardware fracture or loosening. Degenerative changes in the visualized lower lumbar spine. Headache curvilinear calcifications in the left hip joint may indicate chondrocalcinosis. IMPRESSION: No fracture or malalignment in the left hip. Electronically Signed   By: Delbert Phenix M.D.   On: 07/01/2015 14:37   ASSESSMENT AND PLAN:   Cathy Harvey is a 82 y.o. female with a known history of chronic A. fib, hypertension, history of glaucoma comes to the emergency room accompanied by her daughter with a chief complaint. Patient was at a gas station on Thursday  had sustained a fall developed left distal radial and ulnar fracture status post cast at [facility] emergency room. Patient went home had a mechanical fall today started having left hip pain and workup in the emergency room including MRI of the left hip shows subacute incomplete left intertrochanteric fracture.  1. Acute left intertrochanteric fracture status post mechanical fall and left distal radial/ulnar fracture -IV fluids -Orthopedic consultation with Dr Hyacinth Meeker -Patient is an intermediate risk for surgery. Okay to proceed -Continue beat blockers in the perioperative period -Resume aspirin after surgery -When necessary pain meds -Incentive spirometer  2. DVT prophylaxis per Dr. Hyacinth Meeker SCDs and teds for now  3. Chronic A. fib heart rate stable -Continue metoprolol -Resume aspirin after surgery  4. Hyperlipidemia continue statins  5. Left distal ulnar and radial fracture per Dr. Hyacinth Meeker  Case discussed with Care Management/Social Worker. Management plans discussed with the patient, family and they are in agreement.  CODE STATUS: DNR  DVT Prophylaxis: after surgery  TOTAL TIME TAKING CARE OF THIS PATIENT: 30 minutes.  >50% time spent on counselling and coordination of care  POSSIBLE D/C IN 2-3 DAYS, DEPENDING ON CLINICAL CONDITION.  Note: This dictation was prepared with Dragon dictation along with smaller phrase technology. Any transcriptional errors that result from this process are unintentional.  Demyan Fugate M.D on 07/02/2015 at 10:34 AM  Between 7am to 6pm - Pager - [phone]  After 6pm go to [email or web address] - password [email or web address]  [facility]  Office  [phone]  CC: Primary care physician; [facility] INTERNAL MEDICINE

## 2015-07-02 NOTE — Consult Note (Signed)
ORTHOPAEDIC CONSULTATION  REQUESTING PHYSICIAN: Enedina Finner, MD  Chief Complaint: Left wrist and hip pain  HPI: Cathy Harvey is a 82 y.o. female who complains of  left wrist and hip pain.  She fell 3 days ago fracturing the left wrist.  This was treated at the [facility] in [place].  She then fell yesterday injuring the left hip and could not walk.  She was brought to the [facility] emergency room.  X-rays of the hip were negative but MRI showed an incomplete intertrochanteric fracture.  She was admitted for operative treatment of the left wrist and placement.  I discussed the problems and treatment plan with the patient, her son and daughter-in-law.  I recommend reduction and pinning of the left wrist with an open reduction if necessary.  The left hip injury is more of a bone bruise and anything and we will treat her with protected weightbearing.  She will need skilled rehabilitation a while.  With this understanding, they wish to proceed with the wrist surgery today.  Past Medical History  Diagnosis Date  . Stroke (HCC)   . Hypertension   . Hypercholesteremia    Past Surgical History  Procedure Laterality Date  . Abdominal hysterectomy    . Hip surgery     Social History   Social History  . Marital Status: Widowed    Spouse Name: N/A  . Number of Children: N/A  . Years of Education: N/A   Social History Main Topics  . Smoking status: Never Smoker   . Smokeless tobacco: None  . Alcohol Use: No  . Drug Use: No  . Sexual Activity: Not Asked   Other Topics Concern  . None   Social History Narrative  . None   Family History  Problem Relation Age of Onset  . Cancer Other    Allergies  Allergen Reactions  . Codeine Other (See Comments)    Pt unaware of rxn. Patient is allergic to high doses of codeine.    Prior to Admission medications   Medication Sig Start Date End Date Taking? Authorizing Provider  aspirin EC 81 MG tablet Take 81 mg by  mouth daily.   Yes Historical Provider, MD  cilostazol (PLETAL) 100 MG tablet Take 100 mg by mouth 2 (two) times daily.   Yes Historical Provider, MD  dorzolamide (TRUSOPT) 2 % ophthalmic solution Place 1 drop into both eyes 2 (two) times daily.   Yes Historical Provider, MD  ezetimibe (ZETIA) 10 MG tablet Take 10 mg by mouth daily.   Yes Historical Provider, MD  Ferrous Sulfate (FEROSUL PO) Take 1 tablet by mouth daily.   Yes Historical Provider, MD  ferrous sulfate 325 (65 FE) MG tablet Take 325 mg by mouth daily with breakfast.   Yes Historical Provider, MD  HYDROcodone-acetaminophen (NORCO/VICODIN) 5-325 MG tablet Take 1 tablet by mouth every 4 (four) hours as needed for severe pain.   Yes Historical Provider, MD  lisinopril (PRINIVIL,ZESTRIL) 20 MG tablet Take 20 mg by mouth daily.   Yes Historical Provider, MD  metoprolol succinate (TOPROL-XL) 25 MG 24 hr tablet Take 25 mg by mouth daily.   Yes Historical Provider, MD  Multiple Vitamins-Minerals (CENTRUM SILVER PO) Take 1 tablet by mouth daily.   Yes Historical Provider, MD   Dg Wrist Complete Left  07/01/2015  CLINICAL DATA:  Per family pt fell last Friday 2/17, known left wrist fx. Pt fell again today, now c/o left lateral hip pain. EXAM: LEFT  WRIST - COMPLETE 3+ VIEW COMPARISON:  06/29/2015 FINDINGS: Wrist is imaged through plaster splint material. There are transverse fractures of the distal radius and ulna associated with 1 shaft width posterior displacement and significant over riding of fracture fragments. Alignment appears unchanged. There is limited detail given the presence of plaster splinting material. IMPRESSION: Unchanged alignment of distal radius and ulnar fractures. Electronically Signed   By: Norva Pavlov M.D.   On: 07/01/2015 14:39   Mr Hip Left Wo Contrast  07/01/2015  CLINICAL DATA:  Status post fall this morning with a left hip injury. Pain. Initial encounter. EXAM: MR OF THE LEFT HIP WITHOUT CONTRAST TECHNIQUE:  Multiplanar, multisequence MR imaging was performed. No intravenous contrast was administered. COMPARISON:  Plain films earlier today. FINDINGS: Bones: The patient has an acute or subacute incomplete intertrochanteric fracture. The fracture extends from the greater trochanter into the intertrochanteric femur but does not quite reach the lesser trochanter. No other fracture is identified. Artifact from right hip replacement is noted. A well-circumscribed cystic lesion in the proximal diaphysis of the femur measuring 0.6 cm has benign features. Articular cartilage and labrum Articular cartilage:  Mildly degenerated. Labrum:  Mildly degenerated. Joint or bursal effusion Joint effusion:  No effusion. Bursae:  Unremarkable. Muscles and tendons Muscles and tendons: There is fluid is seen about the left gluteus medius attending compatible with strain. Short adductors also demonstrates some intrasubstance edema consistent with strain. No frank tear is identified. Other findings Miscellaneous:  None. IMPRESSION: The study is positive for an acute for subacute incomplete left intertrochanteric fracture. Strain of the short adductors about the left hip and left gluteus medius without tear also noted. Electronically Signed   By: Drusilla Kanner M.D.   On: 07/01/2015 15:58   Dg Hip Unilat With Pelvis 2-3 Views Left  07/01/2015  CLINICAL DATA:  Fall with left hip pain. EXAM: DG HIP (WITH OR WITHOUT PELVIS) 2-3V LEFT COMPARISON:  12/11/2007 CT abdomen/ pelvis. FINDINGS: No fracture, dislocation or suspicious focal osseous lesion in the left hip. Partially visualized right hip hemiarthroplasty, with no evidence of hardware fracture or loosening. Degenerative changes in the visualized lower lumbar spine. Headache curvilinear calcifications in the left hip joint may indicate chondrocalcinosis. IMPRESSION: No fracture or malalignment in the left hip. Electronically Signed   By: Delbert Phenix M.D.   On: 07/01/2015 14:37     Positive ROS: All other systems have been reviewed and were otherwise negative with the exception of those mentioned in the HPI and as above.  Physical Exam: General: Alert, no acute distress Cardiovascular: No pedal edema Respiratory: No cyanosis, no use of accessory musculature GI: No organomegaly, abdomen is soft and non-tender Skin: No lesions in the area of chief complaint Neurologic: Sensation intact distally Psychiatric: Patient is competent for consent with normal mood and affect Lymphatic: No axillary or cervical lymphadenopathy  MUSCULOSKELETAL: Patient is alert and cooperative.  The left arm is encased in a sugar tong splint.  Neurovascular status is good distally.  There is some swelling and ecchymosis.  Left hip shows excellent motion in flexion, extension and rotation.  She is tender laterally only.  Neurovascular status good distally.  Assessment: 1.  Displaced left distal radius and ulna fractures.  2.  Deep bone bruise, left hip  Plan: Reduction and pinning, left wrist.  Possible open reduction.  Skilled nursing stay will be necessary.    Valinda Hoar, MD [phone]   07/02/2015 1:03 PM

## 2015-07-02 NOTE — Anesthesia Procedure Notes (Signed)
Procedure Name: LMA Insertion Date/Time: 07/02/2015 3:20 PM Performed by: Junious Silk Pre-anesthesia Checklist: Patient identified, Patient being monitored, Timeout performed, Emergency Drugs available and Suction available Patient Re-evaluated:Patient Re-evaluated prior to inductionOxygen Delivery Method: Circle system utilized Preoxygenation: Pre-oxygenation with 100% oxygen Intubation Type: IV induction Ventilation: Mask ventilation without difficulty LMA: LMA inserted LMA Size: 3.5 Tube type: Oral Number of attempts: 1 Placement Confirmation: positive ETCO2 and breath sounds checked- equal and bilateral Tube secured with: Tape Dental Injury: Teeth and Oropharynx as per pre-operative assessment

## 2015-07-03 ENCOUNTER — Encounter: Payer: Self-pay | Admitting: Specialist

## 2015-07-03 LAB — URINE CULTURE

## 2015-07-03 MED ORDER — DORZOLAMIDE HCL 2 % OP SOLN
1.0000 [drp] | Freq: Two times a day (BID) | OPHTHALMIC | Status: DC
  Administered 2015-07-03 – 2015-07-04 (×3): 1 [drp] via OPHTHALMIC
  Filled 2015-07-03: qty 10

## 2015-07-03 MED ORDER — MAGNESIUM HYDROXIDE 400 MG/5ML PO SUSP
30.0000 mL | Freq: Every day | ORAL | Status: DC | PRN
  Administered 2015-07-03: 30 mL via ORAL
  Filled 2015-07-03: qty 30

## 2015-07-03 MED ORDER — BISACODYL 10 MG RE SUPP
10.0000 mg | Freq: Every day | RECTAL | Status: DC | PRN
  Filled 2015-07-03: qty 1

## 2015-07-03 NOTE — Progress Notes (Signed)
Subjective: 1 Day Post-Op Procedure(s) (LRB): CLOSED REDUCTION LEFT DISTAL RADIAL FRACTURE WITH PINNING (Left)    Patient reports pain as mild.Very somnolent.  Runningfevers but wound clean and benign.  Some oozing. Will stop lovenox and start asa. Objective:   VITALS:   Filed Vitals:   07/03/15 0800 07/03/15 0917  BP: 150/62   Pulse: 74 72  Temp: 97.8 F (36.6 C)   Resp: 18     Neurovascular intact Sensation intact distally Intact pulses distally Dorsiflexion/Plantar flexion intact Incision: moderate drainage  LABS  Recent Labs  07/01/15 1454  HGB 11.0*  HCT 33.2*  WBC 7.6  PLT 135*     Recent Labs  07/01/15 1454  NA 140  K 3.8  BUN 29*  CREATININE 1.06*  GLUCOSE 144*     Recent Labs  07/01/15 2024  INR 1.13     Assessment/Plan: 1 Day Post-Op Procedure(s) (LRB): CLOSED REDUCTION LEFT DISTAL RADIAL FRACTURE WITH PINNING (Left)   Advance diet Up with therapy Discharge to SNF

## 2015-07-03 NOTE — Progress Notes (Addendum)
Plan is for patient to D/C to [facility] tomorrow. Kim admissions coordinator at [facility] is aware of above. Clinical Child psychotherapist (CSW) contacted patient's daughter in law [place] and left a voicemail. MD and RN aware of above. CSW will continue to follow and assist as needed.   Jetta Lout, LCSW [phone]

## 2015-07-03 NOTE — Evaluation (Addendum)
Occupational Therapy Evaluation Patient Details Name: Cathy Harvey MRN: [id number] DOB: 22-Jun-1921 Today's Date: 07/03/2015    History of Present Illness This patient is a 82 year old female who came to [facility] after a fall suffering a left wrist fracture. She had an ORIF repair.   Clinical Impression   This patient is a 82 year old female who came to [facility] after a fall suffering a left wrist fracture. She lives alone in a one story home with 2 steps and a rail with a half basement where the laundry is. She had been independent with activities of daily living including driving. She now has deficits with L UE and activities of daily living. She would benefit from Occupational Therapy for ADL/functional mobility training.    Follow Up Recommendations       Equipment Recommendations       Recommendations for Other Services       Precautions / Restrictions Precautions Precautions: Fall Restrictions Weight Bearing Restrictions: Yes      Mobility Bed Mobility                  Transfers                      Balance                                            ADL                                         General ADL Comments: Had been independent and living alone and driving.  She now needs assist. Today practiced techniques for lower body dressing. Patient attempted to reach feet but was unable. Practiced lower body dressing using hip kit. She needed assist to put sock on sock aid but was then able to pull sock on. She was able to doff sock with reacher. Practiced techniques using reacher to donn pants. Most likely will not need hip kit in a few days.     Vision     Perception     Praxis      Pertinent Vitals/Pain Pain Assessment: No/denies pain     Hand Dominance     Extremity/Trunk Assessment Upper Extremity Assessment Upper Extremity Assessment: Overall WFL for tasks assessed    Lower Extremity Assessment Lower Extremity Assessment: Defer to PT evaluation       Communication Communication Communication: No difficulties;HOH   Cognition Arousal/Alertness: Awake/alert Behavior During Therapy: WFL for tasks assessed/performed Overall Cognitive Status: Within Functional Limits for tasks assessed                     General Comments       Exercises       Shoulder Instructions      Home Living Family/patient expects to be discharged to:: Private residence Living Arrangements: Alone   Type of Home: House Home Access: Stairs to enter Technical brewer of Steps: 2 Entrance Stairs-Rails:  (rail) Home Layout: One level     Bathroom Shower/Tub: Walk-in shower         Home Equipment:  (has walker and cane but does not use )          Prior Functioning/Environment Level of  Independence: Independent        Comments: Had been driving.    OT Diagnosis: Generalized weakness   OT Problem List: Decreased range of motion;Decreased activity tolerance;Decreased knowledge of use of DME or AE;Impaired UE functional use   OT Treatment/Interventions: Self-care/ADL training;Therapeutic exercise    OT Goals(Current goals can be found in the care plan section) Acute Rehab OT Goals Patient Stated Goal: not sure, may live with son for a while OT Goal Formulation: With patient Time For Goal Achievement: 07/17/15 Potential to Achieve Goals: Good  OT Frequency: Min 1X/week   Barriers to D/C:            Co-evaluation              End of Session Equipment Utilized During Treatment:  (hip kit)  Activity Tolerance:   Patient left: in chair;with call bell/phone within reach;with chair alarm set   Time: 3838-1840 OT Time Calculation (min): 32 min Charges:  OT General Charges $OT Visit: 1 Procedure OT Evaluation $OT Eval Low Complexity: 1 Procedure OT Treatments $Self Care/Home Management : 8-22 mins G-Codes:    Myrene Galas, MS/OTR/L  07/03/2015, 11:17 AM

## 2015-07-03 NOTE — Evaluation (Signed)
Physical Therapy Evaluation Patient Details Name: Cathy Harvey MRN: [id number] DOB: 1921-09-16 Today's Date: 07/03/2015   History of Present Illness  This patient is a 82 year old female with PMH of chronic A-Fib, HTN, glaucoma.  Pt came to [facility] after a fall suffering a left wrist fracture and a left intertrochanteric fracture (pt s/p fall 06/28/15 sustaining distal radius and ulnar fx's and seen in outside hospital; pt then fell again and imaging showing acute to subacute incomplete L intertrochanteric fx but per ortho MD note appears to be a bone bruise and is treating it conservatively with protective WB'ing). Pt s/p ORIF repair on the left wrist (07-02-15).   Clinical Impression  Prior to admission pt was independent and was still driving.  Pt has a quad cane and RW and was not using them.  Pt lives alone.  Pt was min guard with bed mobility, mod assist for sit to stand transfer (with platform RW) and min assist for ambulation (total 3 feet ambulation with platform RW bed to chair with heavy cues for pivoting technique to maintain WB'ing precautions but pt with significant difficulty following these directions).  Pt required consistent verbal and tactile cues for mobility in order to maintain L sided PWB 25% precautions.  Due to aforementioned function and strength deficits, pt is in need of skilled physical therapy.  It is recommended that pt is discharged to SNF when medically appropriate.     Follow Up Recommendations SNF    Equipment Recommendations       Recommendations for Other Services       Precautions / Restrictions Precautions Precautions: Fall Restrictions Weight Bearing Restrictions: Yes Other Position/Activity Restrictions: 25% WB on left  (L UE and L LE)     Mobility  Bed Mobility Overal bed mobility: Needs Assistance Bed Mobility: Rolling;Sidelying to Sit Rolling: Min guard Sidelying to sit: Min guard    Required verbal cues to maintain L UE and LE PWB  precautions.      Transfers Overall transfer level: Needs assistance Equipment used: Rolling walker (2 wheeled) Transfers: Sit to/from Stand (RW) Sit to Stand: Mod assist      Required verbal and tactile cues to maintain L UE and LE PWB precautions. Posterior lean with standing.      Ambulation/Gait Ambulation/Gait assistance: Min assist Ambulation Distance (Feet): 3 Feet Assistive device:  (25% PWB on L side ) Gait Pattern/deviations: Shuffle Gait velocity: decreased    Required verbal and tactile cues to maintain L PWB precautions.  Stairs            Wheelchair Mobility    Modified Rankin (Stroke Patients Only)       Balance Overall balance assessment: Needs assistance Sitting-balance support: Feet supported Sitting balance-Leahy Scale: Good     Standing balance support: Bilateral upper extremity supported (RW with 25% PWB on Left UE ) Standing balance-Leahy Scale: Poor                               Pertinent Vitals/Pain Pain Assessment: No/denies pain  See flow sheet for vitals.     Home Living Family/patient expects to be discharged to:: Private residence Living Arrangements: Alone   Type of Home: House Home Access: Stairs to enter Entrance Stairs-Rails: None Entrance Stairs-Number of Steps: 2 Home Layout: One level Home Equipment: Environmental consultant - 2 wheels;Cane - quad      Prior Function Level of Independence: Independent  Comments: Had been driving.     Hand Dominance        Extremity/Trunk Assessment   Upper Extremity Assessment: Overall WFL for tasks assessed           Lower Extremity Assessment: Generalized weakness      Cervical / Trunk Assessment: Normal  Communication   Communication: No difficulties;HOH  Cognition Arousal/Alertness: Awake/alert Behavior During Therapy: WFL for tasks assessed/performed Overall Cognitive Status: Within Functional Limits for tasks assessed                       General Comments   Nursing was contacted and cleared pt for physical therapy.  Pt was agreeable and tolerated session well.  A call has been initiated to Dr. Hyacinth Meeker to further clarify PWB precautions and waiting for call back.     Exercises        Assessment/Plan    PT Assessment Patient needs continued PT services  PT Diagnosis Difficulty walking   PT Problem List Decreased strength;Decreased range of motion;Decreased activity tolerance;Decreased balance;Decreased mobility;Decreased knowledge of precautions  PT Treatment Interventions DME instruction;Gait training;Stair training;Functional mobility training;Therapeutic activities;Therapeutic exercise;Patient/family education   PT Goals (Current goals can be found in the Care Plan section) Acute Rehab PT Goals Patient Stated Goal: to go home  PT Goal Formulation: With patient Time For Goal Achievement: 07/17/15 Potential to Achieve Goals: Good    Frequency BID   Barriers to discharge        Co-evaluation               End of Session Equipment Utilized During Treatment: Gait belt Activity Tolerance: Patient tolerated treatment well Patient left: in chair;with call bell/phone within reach;with chair alarm set;with family/visitor present;with SCD's reapplied Nurse Communication: Mobility status         Time: 1610-9604 PT Time Calculation (min) (ACUTE ONLY): 36 min   Charges:         PT G Codes:       Lyndel Safe, SPT Lyndel Safe 07/03/2015, 11:23 AM

## 2015-07-03 NOTE — Progress Notes (Signed)
Physical Therapy Treatment Patient Details Name: Cathy Harvey MRN: [id number] DOB: 1921-12-13 Today's Date: 07/03/2015    History of Present Illness This patient is a 82 year old female with PMH of chronic A-Fib, HTN, glaucoma. Pt came to [facility] after a fall suffering a left wrist fracture and a left intertrochanteric fracture (pt s/p fall 06/28/15 sustaining distal radius and ulnar fx's and seen in outside hospital; pt then fell again and imaging showing acute to subacute incomplete L intertrochanteric fx but per ortho MD note appears to be a bone bruise and is treating it conservatively with protective WB'ing). Pt s/p ORIF repair on the left wrist (07-02-15).    PT Comments    Pt was incontinent in chair when session began.  Pt was moderate assist for sit to stand (2 sets: chair and then bedside commode) and mod assist for stand pivot transfer (chair to bedside commode).  Pt voided in commode and was cleaned and gown was changed.  Pt required intermittent verbal and tactile cues for hand and foot placement and also 25% PWB L sided (UE and LE) precautions.  Pt limited to transfers d/t WB'ing precautions.     Follow Up Recommendations  SNF     Equipment Recommendations       Recommendations for Other Services       Precautions / Restrictions Precautions Precautions: Fall Restrictions Weight Bearing Restrictions: Yes LUE Weight Bearing: Partial weight bearing LUE Partial Weight Bearing Percentage or Pounds: 25% (using platform RW) LLE Weight Bearing: Partial weight bearing LLE Partial Weight Bearing Percentage or Pounds: 25% (with platform RW) Other Position/Activity Restrictions: 25% WB on left (UE and LE) (nurse from Dr. Rondel Baton office called PT back to clarify this)   Mobility  Bed Mobility Overal bed mobility: Needs Assistance Bed Mobility: Sit to Sidelying Rolling: Mod assist      Sit to sidelying: Mod assist   Verbal and Tactile cues required for hand and foot  placement and to maintain 25% PWB L (UE and LE) precautions.  Transfers Overall transfer level: Needs assistance Equipment used: Rolling walker (2 wheeled) (Platform on left side ) Transfers: Sit to/from [employer] Sit to Stand: Mod assist (2 separate sets: chair , bedside commode ) Stand pivot transfers: Mod assist (2 separate sets: chair to bedside commode)      Verbal and Tactile cues required for hand and foot placement and to maintain 25% PWB L (UE and LE) precautions.    Ambulation/Gait                 Stairs            Wheelchair Mobility    Modified Rankin (Stroke Patients Only)       Balance Overall balance assessment: Needs assistance Sitting-balance support: Feet supported Sitting balance-Leahy Scale: Good     Standing balance support: Bilateral upper extremity supported (RW with platform on left side ) Standing balance-Leahy Scale: Poor       Verbal and Tactile cues required for hand and foot placement and to maintain 25% PWB L (UE and LE) precautions.  Pt exhibited posterior lean.                 Cognition Arousal/Alertness: Awake/alert Behavior During Therapy: WFL for tasks assessed/performed Overall Cognitive Status: Within Functional Limits for tasks assessed Area of Impairment: Orientation Orientation Level: Person   Memory: Decreased short-term memory         General Comments: Pt's niece was present to confirm  cognitive impairment    Exercises      General Comments   Nursing was contacted and cleared pt for physical therapy.  Pt was agreeable and tolerated session well.  Pt's daughter in law was present at the end of the session.       Pertinent Vitals/Pain Pain Assessment: No/denies pain  See flow sheet for vitals.     Home Living                      Prior Function            PT Goals (current goals can now be found in the care plan section) Acute Rehab PT Goals Patient Stated Goal:  to go home  PT Goal Formulation: With patient Time For Goal Achievement: 07/17/15 Potential to Achieve Goals: Good Progress towards PT goals: Progressing toward goals    Frequency  BID    PT Plan Current plan remains appropriate    Co-evaluation             End of Session Equipment Utilized During Treatment: Gait belt Activity Tolerance: Patient tolerated treatment well Patient left: in bed;with call bell/phone within reach;with bed alarm set;with SCD's reapplied;with family/visitor present     Time: 1308-6578 PT Time Calculation (min) (ACUTE ONLY): 37 min  Charges:                       G Codes:      Lyndel Safe, SPT Lyndel Safe 07/03/2015, 4:07 PM

## 2015-07-03 NOTE — Progress Notes (Signed)
[facility] - [place] at [facility]   PATIENT NAME: Cathy Harvey    MR#:  [id number]  DATE OF BIRTH:  05-31-1921  SUBJECTIVE:  POD #1  No issues per RN Doing well no pain  REVIEW OF SYSTEMS:   Review of Systems  Constitutional: Negative for fever, chills and weight loss.  HENT: Negative for ear discharge, ear pain and nosebleeds.   Eyes: Negative for blurred vision, pain and discharge.  Respiratory: Negative for sputum production, shortness of breath, wheezing and stridor.   Cardiovascular: Negative for chest pain, palpitations, orthopnea and PND.  Gastrointestinal: Negative for nausea, vomiting, abdominal pain and diarrhea.  Genitourinary: Negative for urgency and frequency.  Musculoskeletal: Positive for joint pain. Negative for back pain.  Neurological: Negative for sensory change, speech change, focal weakness and weakness.  Psychiatric/Behavioral: Negative for depression and hallucinations. The patient is not nervous/anxious.   All other systems reviewed and are negative.  Tolerating Diet:npo for surgery Tolerating PT: pending  DRUG ALLERGIES:   Allergies  Allergen Reactions  . Codeine Other (See Comments)    Pt unaware of rxn. Patient is allergic to high doses of codeine.     VITALS:  Blood pressure 150/62, pulse 74, temperature 97.8 F (36.6 C), temperature source Oral, resp. rate 18, height 5' 6.5" (1.689 m), weight 56.7 kg (125 lb), SpO2 96 %.  PHYSICAL EXAMINATION:   Physical Exam  GENERAL:  82 y.o.-year-old patient lying in the bed with no acute distress.  EYES: Pupils equal, round, reactive to light and accommodation. No scleral icterus. Extraocular muscles intact.  HEENT: Head atraumatic, normocephalic. Oropharynx and nasopharynx clear.  NECK:  Supple, no jugular venous distention. No thyroid enlargement, no tenderness.  LUNGS: Normal breath sounds bilaterally, no wheezing, rales, rhonchi. No use of accessory muscles of  respiration.  CARDIOVASCULAR: S1, S2 normal. No murmurs, rubs, or gallops.  ABDOMEN: Soft, nontender, nondistended. Bowel sounds present. No organomegaly or mass.  EXTREMITIES: No cyanosis, clubbing or edema b/l.   Left UE cast+ NEUROLOGIC: Cranial nerves II through XII are intact. No focal Motor or sensory deficits b/l.   PSYCHIATRIC:  patient is alert and oriented x 3.  SKIN: No obvious rash, lesion, or ulcer.   LABORATORY PANEL:  CBC  Recent Labs Lab 07/01/15 1454  WBC 7.6  HGB 11.0*  HCT 33.2*  PLT 135*    Chemistries   Recent Labs Lab 07/01/15 1454  NA 140  K 3.8  CL 108  CO2 24  GLUCOSE 144*  BUN 29*  CREATININE 1.06*  CALCIUM 8.8*  AST 28  ALT 20  ALKPHOS 68  BILITOT 1.0   Cardiac Enzymes  Recent Labs Lab 07/01/15 1454  TROPONINI 0.03   RADIOLOGY:  Dg Wrist Complete Left  07/01/2015  CLINICAL DATA:  Per family pt fell last Friday 2/17, known left wrist fx. Pt fell again today, now c/o left lateral hip pain. EXAM: LEFT WRIST - COMPLETE 3+ VIEW COMPARISON:  06/29/2015 FINDINGS: Wrist is imaged through plaster splint material. There are transverse fractures of the distal radius and ulna associated with 1 shaft width posterior displacement and significant over riding of fracture fragments. Alignment appears unchanged. There is limited detail given the presence of plaster splinting material. IMPRESSION: Unchanged alignment of distal radius and ulnar fractures. Electronically Signed   By: Norva Pavlov M.D.   On: 07/01/2015 14:39   Mr Hip Left Wo Contrast  07/01/2015  CLINICAL DATA:  Status post fall this morning with a left  hip injury. Pain. Initial encounter. EXAM: MR OF THE LEFT HIP WITHOUT CONTRAST TECHNIQUE: Multiplanar, multisequence MR imaging was performed. No intravenous contrast was administered. COMPARISON:  Plain films earlier today. FINDINGS: Bones: The patient has an acute or subacute incomplete intertrochanteric fracture. The fracture extends from  the greater trochanter into the intertrochanteric femur but does not quite reach the lesser trochanter. No other fracture is identified. Artifact from right hip replacement is noted. A well-circumscribed cystic lesion in the proximal diaphysis of the femur measuring 0.6 cm has benign features. Articular cartilage and labrum Articular cartilage:  Mildly degenerated. Labrum:  Mildly degenerated. Joint or bursal effusion Joint effusion:  No effusion. Bursae:  Unremarkable. Muscles and tendons Muscles and tendons: There is fluid is seen about the left gluteus medius attending compatible with strain. Short adductors also demonstrates some intrasubstance edema consistent with strain. No frank tear is identified. Other findings Miscellaneous:  None. IMPRESSION: The study is positive for an acute for subacute incomplete left intertrochanteric fracture. Strain of the short adductors about the left hip and left gluteus medius without tear also noted. Electronically Signed   By: Drusilla Kanner M.D.   On: 07/01/2015 15:58   Dg Hip Unilat With Pelvis 2-3 Views Left  07/01/2015  CLINICAL DATA:  Fall with left hip pain. EXAM: DG HIP (WITH OR WITHOUT PELVIS) 2-3V LEFT COMPARISON:  12/11/2007 CT abdomen/ pelvis. FINDINGS: No fracture, dislocation or suspicious focal osseous lesion in the left hip. Partially visualized right hip hemiarthroplasty, with no evidence of hardware fracture or loosening. Degenerative changes in the visualized lower lumbar spine. Headache curvilinear calcifications in the left hip joint may indicate chondrocalcinosis. IMPRESSION: No fracture or malalignment in the left hip. Electronically Signed   By: Delbert Phenix M.D.   On: 07/01/2015 14:37   ASSESSMENT AND PLAN:   Cathy Harvey is a 82 y.o. female with a known history of chronic A. fib, hypertension, history of glaucoma comes to the emergency room accompanied by her daughter with a chief complaint. Patient was at a gas station on Thursday  had sustained a fall developed left distal radial and ulnar fracture status post cast at [facility] emergency room. Patient went home had a mechanical fall today started having left hip pain and workup in the emergency room including MRI of the left hip shows subacute incomplete left intertrochanteric fracture.  1. Acute left intertrochanteric fracture status post mechanical fall  Treat conservatively with rehab  Per Dr Hyacinth Meeker -Acute left distal radial/ulnar fracture -IV fluids -Orthopedic consultation with Dr Hyacinth Meeker pt is s/p left radius ulnar fracture repair POD #1 -Continue beat blockers in the perioperative period -Resume aspirin after surgery -When necessary pain meds -Incentive spirometer  2. DVT prophylaxis per Dr. Hyacinth Meeker SCDs and teds for now  3. Chronic A. fib heart rate stable -Continue metoprolol -Resume aspirin after surgery  4. Hyperlipidemia continue statins  5. Left distal ulnar and radial fracture per Dr. Hyacinth Meeker POD #1   CSW for rehab  Case discussed with Care Management/Social Worker. Management plans discussed with the patient, family and they are in agreement.  CODE STATUS: DNR  DVT Prophylaxis: asa 325 mg bid per dr Hyacinth Meeker  TOTAL TIME TAKING CARE OF THIS PATIENT: 30 minutes.  >50% time spent on counselling and coordination of care  POSSIBLE D/C IN 2-3 DAYS, DEPENDING ON CLINICAL CONDITION.  Note: This dictation was prepared with Dragon dictation along with smaller phrase technology. Any transcriptional errors that result from this process are unintentional.  Daje Stark M.D  on 07/03/2015 at 8:25 AM  Between 7am to 6pm - Pager - [phone]  After 6pm go to [email or web address] - password [email or web address] [facility]  [place] [facility]  Office  [phone]  CC: Primary care physician; [facility] INTERNAL MEDICINE

## 2015-07-03 NOTE — Progress Notes (Signed)
Plan is for patient to D/C to [facility] tomorrow pending medical clearance. Kim admissions coordinator at [facility] is aware of above. Patient and daughter in law Angelique Blonder are aware of above. MD is aware of above. Clinical Social Worker (CSW) will continue to follow and assist as needed.   Jetta Lout, LCSW [phone]

## 2015-07-04 ENCOUNTER — Encounter
Admission: RE | Admit: 2015-07-04 | Discharge: 2015-07-04 | Disposition: A | Discharge status: Home / Self Care | Payer: Medicare Other | Source: Ambulatory Visit | Attending: Internal Medicine | Admitting: Internal Medicine

## 2015-07-04 DIAGNOSIS — S72142D Displaced intertrochanteric fracture of left femur, subsequent encounter for closed fracture with routine healing: Secondary | ICD-10-CM | POA: Insufficient documentation

## 2015-07-04 DIAGNOSIS — X58XXXD Exposure to other specified factors, subsequent encounter: Secondary | ICD-10-CM | POA: Insufficient documentation

## 2015-07-04 MED ORDER — CEPHALEXIN 500 MG PO CAPS: 500.0000 mg | ORAL_CAPSULE | Freq: Two times a day (BID) | ORAL | Status: AC

## 2015-07-04 MED ORDER — ASPIRIN 325 MG PO TBEC: 325.0000 mg | DELAYED_RELEASE_TABLET | Freq: Two times a day (BID) | ORAL | Status: DC

## 2015-07-04 MED ORDER — HYDROCODONE-ACETAMINOPHEN 5-325 MG PO TABS: 1.0000 | ORAL_TABLET | ORAL | Status: AC | PRN

## 2015-07-04 MED ORDER — CEPHALEXIN 500 MG PO CAPS
500.0000 mg | ORAL_CAPSULE | Freq: Two times a day (BID) | ORAL | Status: DC
  Administered 2015-07-04: 500 mg via ORAL
  Filled 2015-07-04: qty 1

## 2015-07-04 MED ORDER — ASPIRIN 325 MG PO TBEC: 325.0000 mg | DELAYED_RELEASE_TABLET | Freq: Two times a day (BID) | ORAL | Status: AC

## 2015-07-04 NOTE — Progress Notes (Signed)
EMS called at [phone]. Patient to be transfer to [facility] today. Report called to Jenny,RN at 1049. Waiting for transport staff.

## 2015-07-04 NOTE — Progress Notes (Signed)
Patient is medically stable for D/C to [facility] today. Per Kim admissions coordinator at [facility] patient is going to room 214-B. RN will call report at [phone] and arrange EMS for transport. Clinical Child psychotherapist (CSW) sent D/C Summary, FL2 and D/C Packet to [employer] via [employer]. Patient is aware of above. CSW contacted patient's daughter in law Angelique Blonder and made her aware of above. Please reconsult if future social work needs arise. CSW signing off.   Jetta Lout, LCSW [phone]

## 2015-07-04 NOTE — Progress Notes (Signed)
Patient constipated Prime doc notified for PRN bowel prep. Dr.Mody notified placing new orders.

## 2015-07-04 NOTE — Clinical Social Work Placement (Signed)
   CLINICAL SOCIAL WORK PLACEMENT  NOTE  Date:  07/04/2015  Patient Details  Name: Cathy Harvey MRN: [id number] Date of Birth: 06-10-1921  Clinical Social Work is seeking post-discharge placement for this patient at the Skilled  Nursing Facility level of care (*CSW will initial, date and re-position this form in  chart as items are completed):  Yes   Patient/family provided with [facility] Clinical Social Work Department's list of facilities offering this level of care within the geographic area requested by the patient (or if unable, by the patient's family).  Yes   Patient/family informed of their freedom to choose among providers that offer the needed level of care, that participate in Medicare, Medicaid or managed care program needed by the patient, have an available bed and are willing to accept the patient.  Yes   Patient/family informed of [facility]'s ownership interest in [facility] and [facility], as well as of the fact that they are under no obligation to receive care at these facilities.  PASRR submitted to EDS on 07/02/15     PASRR number received on 07/02/15     Existing PASRR number confirmed on       FL2 transmitted to all facilities in geographic area requested by pt/family on 07/02/15     FL2 transmitted to all facilities within larger geographic area on       Patient informed that his/her managed care company has contracts with or will negotiate with certain facilities, including the following:        Yes   Patient/family informed of bed offers received.  Patient chooses bed at  [facility] )     Physician recommends and patient chooses bed at      Patient to be transferred to  [facility] ) on 07/04/15.  Patient to be transferred to facility by  [facility] )     Patient family notified on 07/04/15 of transfer.  Name of family member notified:   (Patient's daughter in law Cathy Harvey is aware of D/C today. )      PHYSICIAN       Additional Comment:    _______________________________________________ Haig Prophet, LCSW 07/04/2015, 10:43 AM

## 2015-07-04 NOTE — Discharge Instructions (Signed)
PT at rehab °

## 2015-07-04 NOTE — Discharge Summary (Signed)
[facility] - [place] at [facility]   PATIENT NAME:Cathy Harvey    MR#:  [id number]  DATE OF BIRTH:  02/13/1922  DATE OF ADMISSION:  07/01/2015 ADMITTING PHYSICIAN: Enedina Finner, MD  DATE OF DISCHARGE: 07/04/15  PRIMARY CARE PHYSICIAN: [facility]    ADMISSION DIAGNOSIS:  Pain [R52] Fall [W19.XXXA] Hip fracture, left, closed, initial encounter (HCC) [S72.002A]  DISCHARGE DIAGNOSIS:  -Left distal ulna radius fracture status post surgery day 2 -Left hip pain status post fall no acute fracture per orthopedic evaluation requiring any surgery treat conservatively with rehabilitation -Hypertension -klebsiella UTI SECONDARY DIAGNOSIS:   Past Medical History  Diagnosis Date  . Stroke (HCC)   . Hypertension   . Hypercholesteremia     HOSPITAL COURSE:  Cathy Harvey is a 82 y.o. female with a known history of chronic A. fib, hypertension, history of glaucoma comes to the emergency room accompanied by her daughter with a chief complaint. Patient was at a gas station on Thursday had sustained a fall developed left distal radial and ulnar fracture status post cast at [facility] emergency room. Patient went home had a mechanical fall today started having left hip pain and workup in the emergency room including MRI of the left hip shows subacute incomplete left intertrochanteric fracture.  1. Acute left mild intertrochanteric fracture status post mechanical fall Treat conservatively with rehab Per Dr Hyacinth Meeker -Acute left distal radial/ulnar fracture status post surgery postop day 2 -Orthopedic consultation with Dr Hyacinth Meeker pt is s/p left radius ulnar fracture repair POD #2 -Continue beat blockers in the perioperative period -Resumed aspirin after surgery -When necessary pain meds -Incentive spirometer  2. DVT prophylaxis per Dr. Hyacinth Meeker SCDs and teds for now -Aspirin 325 twice a day  3. Chronic A. fib heart rate stable -Continue  metoprolol -Resume aspirin after surgery  4. Hyperlipidemia continue statins  5. Klebsiella UTI -We'll treat patient with by mouth Keflex for 5 days given recent fall and surgery  CSW for rehab-patient to discharge to [facility] today.  CONSULTS OBTAINED:  Treatment Team:  Deeann Saint, MD  DRUG ALLERGIES:   Allergies  Allergen Reactions  . Codeine Other (See Comments)    Pt unaware of rxn. Patient is allergic to high doses of codeine.     DISCHARGE MEDICATIONS:   Current Discharge Medication List    START taking these medications   Details  cephALEXin (KEFLEX) 500 MG capsule Take 1 capsule (500 mg total) by mouth every 12 (twelve) hours. Qty: 10 capsule, Refills: 0      CONTINUE these medications which have CHANGED   Details  aspirin 325 MG EC tablet Take 1 tablet (325 mg total) by mouth 2 (two) times daily. Qty: 60 tablet, Refills: 0    HYDROcodone-acetaminophen (NORCO/VICODIN) 5-325 MG tablet Take 1 tablet by mouth every 4 (four) hours as needed for severe pain. Qty: 30 tablet, Refills: 0      CONTINUE these medications which have NOT CHANGED   Details  cilostazol (PLETAL) 100 MG tablet Take 100 mg by mouth 2 (two) times daily.    dorzolamide (TRUSOPT) 2 % ophthalmic solution Place 1 drop into both eyes 2 (two) times daily.    ezetimibe (ZETIA) 10 MG tablet Take 10 mg by mouth daily.    Ferrous Sulfate (FEROSUL PO) Take 1 tablet by mouth daily.    ferrous sulfate 325 (65 FE) MG tablet Take 325 mg by mouth daily with breakfast.    lisinopril (PRINIVIL,ZESTRIL) 20 MG tablet Take  20 mg by mouth daily.    metoprolol succinate (TOPROL-XL) 25 MG 24 hr tablet Take 25 mg by mouth daily.    Multiple Vitamins-Minerals (CENTRUM SILVER PO) Take 1 tablet by mouth daily.        If you experience worsening of your admission symptoms, develop shortness of breath, life threatening emergency, suicidal or homicidal thoughts you must seek medical attention immediately by  calling 911 or calling your MD immediately  if symptoms less severe.  You Must read complete instructions/literature along with all the possible adverse reactions/side effects for all the Medicines you take and that have been prescribed to you. Take any new Medicines after you have completely understood and accept all the possible adverse reactions/side effects.   Please note  You were cared for by a hospitalist during your hospital stay. If you have any questions about your discharge medications or the care you received while you were in the hospital after you are discharged, you can call the unit and asked to speak with the hospitalist on call if the hospitalist that took care of you is not available. Once you are discharged, your primary care physician will handle any further medical issues. Please note that NO REFILLS for any discharge medications will be authorized once you are discharged, as it is imperative that you return to your primary care physician (or establish a relationship with a primary care physician if you do not have one) for your aftercare needs so that they can reassess your need for medications and monitor your lab values. Today   SUBJECTIVE   Doing well no complaints  VITAL SIGNS:  Blood pressure 152/75, pulse 52, temperature 97.8 F (36.6 C), temperature source Oral, resp. rate 18, height 5' 6.5" (1.689 m), weight 56.7 kg (125 lb), SpO2 98 %.  I/O:    Intake/Output Summary (Last 24 hours) at 07/04/15 0843 Last data filed at 07/04/15 0400  Gross per 24 hour  Intake 991.25 ml  Output      0 ml  Net 991.25 ml    PHYSICAL EXAMINATION:  GENERAL:  82 y.o.-year-old patient lying in the bed with no acute distress.  EYES: Pupils equal, round, reactive to light and accommodation. No scleral icterus. Extraocular muscles intact.  HEENT: Head atraumatic, normocephalic. Oropharynx and nasopharynx clear.  NECK:  Supple, no jugular venous distention. No thyroid enlargement,  no tenderness.  LUNGS: Normal breath sounds bilaterally, no wheezing, rales,rhonchi or crepitation. No use of accessory muscles of respiration.  CARDIOVASCULAR: S1, S2 normal. No murmurs, rubs, or gallops.  ABDOMEN: Soft, non-tender, non-distended. Bowel sounds present. No organomegaly or mass.  EXTREMITIES: No pedal edema, cyanosis, or clubbing. Left upper extremity cast present NEUROLOGIC: Cranial nerves II through XII are intact. Muscle strength 5/5 in all extremities. Sensation intact. Gait not checked.  PSYCHIATRIC: The patient is alert and oriented x 3.  SKIN: No obvious rash, lesion, or ulcer.   DATA REVIEW:   CBC   Recent Labs Lab 07/01/15 1454  WBC 7.6  HGB 11.0*  HCT 33.2*  PLT 135*    Chemistries   Recent Labs Lab 07/01/15 1454  NA 140  K 3.8  CL 108  CO2 24  GLUCOSE 144*  BUN 29*  CREATININE 1.06*  CALCIUM 8.8*  AST 28  ALT 20  ALKPHOS 68  BILITOT 1.0    Microbiology Results   Recent Results (from the past 240 hour(s))  Urine culture     Status: None   Collection Time: 07/01/15  4:40 PM  Result Value Ref Range Status   Specimen Description URINE, RANDOM  Final   Special Requests NONE  Final   Culture   Final    30,000 COLONIES/mL KLEBSIELLA PNEUMONIAE WITH MIXED BACTERIAL ORGANISMS    Report Status 07/03/2015 FINAL  Final   Organism ID, Bacteria KLEBSIELLA PNEUMONIAE  Final      Susceptibility   Klebsiella pneumoniae - MIC*    AMPICILLIN 16 RESISTANT Resistant     CEFAZOLIN <=4 SENSITIVE Sensitive     CEFTRIAXONE <=1 SENSITIVE Sensitive     CIPROFLOXACIN <=0.25 SENSITIVE Sensitive     GENTAMICIN <=1 SENSITIVE Sensitive     IMIPENEM <=0.25 SENSITIVE Sensitive     NITROFURANTOIN <=16 SENSITIVE Sensitive     TRIMETH/SULFA <=20 SENSITIVE Sensitive     AMPICILLIN/SULBACTAM 4 SENSITIVE Sensitive     PIP/TAZO <=4 SENSITIVE Sensitive     Extended ESBL NEGATIVE Sensitive     * 30,000 COLONIES/mL KLEBSIELLA PNEUMONIAE  Surgical PCR screen      Status: None   Collection Time: 07/02/15  7:20 AM  Result Value Ref Range Status   MRSA, PCR NEGATIVE NEGATIVE Final   Staphylococcus aureus NEGATIVE NEGATIVE Final    Comment:        The Xpert SA Assay (FDA approved for NASAL specimens in patients over 40 years of age), is one component of a comprehensive surveillance program.  Test performance has been validated by [facility] for patients greater than or equal to 35 year old. It is not intended to diagnose infection nor to guide or monitor treatment.     RADIOLOGY:  No results found.   Management plans discussed with the patient, family and they are in agreement.  CODE STATUS:  Code Status History    Date Active Date Inactive Code Status Order ID Comments User Context   07/01/2015  8:00 PM 07/02/2015  5:21 PM DNR [id number]  Enedina Finner, MD Inpatient    Questions for Most Recent Historical Code Status (Order [id number])    Question Answer Comment   In the event of cardiac or respiratory ARREST Do not call a "code blue"    In the event of cardiac or respiratory ARREST Do not perform Intubation, CPR, defibrillation or ACLS    In the event of cardiac or respiratory ARREST Use medication by any route, position, wound care, and other measures to relive pain and suffering. May use oxygen, suction and manual treatment of airway obstruction as needed for comfort.     Advance Directive Documentation        Most Recent Value   Type of Advance Directive  Healthcare Power of Attorney, Living will   Pre-existing out of facility DNR order (yellow form or pink MOST form)     "MOST" Form in Place?        TOTAL TIME TAKING CARE OF THIS PATIENT: 40 minutes.    Kymia Simi M.D on 07/04/2015 at 8:43 AM  Between 7am to 6pm - Pager - [phone] After 6pm go to [email or web address] - password [email or web address] [facility]  [place] [facility]  Office  [phone]  CC: Primary care physician; [facility] INTERNAL MEDICINE

## 2015-07-04 NOTE — Care Management Important Message (Signed)
Important Message  Patient Details  Name: Cathy Harvey MRN: [id number] Date of Birth: May 10, 1922   Medicare Important Message Given:  Yes    Avika Carbine A, RN 07/04/2015, 8:22 AM

## 2015-07-04 NOTE — Progress Notes (Signed)
EMS staff here to take patient to [facility] place via stretcher.

## 2015-07-06 LAB — BASIC METABOLIC PANEL
Anion gap: 5 (ref 5–15)
BUN: 29 mg/dL — AB (ref 6–20)
CALCIUM: 8.1 mg/dL — AB (ref 8.9–10.3)
CO2: 24 mmol/L (ref 22–32)
CREATININE: 1.26 mg/dL — AB (ref 0.44–1.00)
Chloride: 112 mmol/L — ABNORMAL HIGH (ref 101–111)
GFR calc non Af Amer: 36 mL/min — ABNORMAL LOW (ref >60)
GFR, EST AFRICAN AMERICAN: 42 mL/min — AB (ref >60)
Glucose, Bld: 123 mg/dL — ABNORMAL HIGH (ref 65–99)
Potassium: 3.7 mmol/L (ref 3.5–5.1)
SODIUM: 141 mmol/L (ref 135–145)

## 2015-07-06 LAB — CBC WITH DIFFERENTIAL/PLATELET
BASOS PCT: 0 %
Basophils Absolute: 0 10*3/uL (ref 0–0.1)
EOS ABS: 0.2 10*3/uL (ref 0–0.7)
Eosinophils Relative: 3 %
HCT: 29.1 % — ABNORMAL LOW (ref 35.0–47.0)
Hemoglobin: 9.7 g/dL — ABNORMAL LOW (ref 12.0–16.0)
Lymphocytes Relative: 16 %
Lymphs Abs: 1 10*3/uL (ref 1.0–3.6)
MCH: 29.9 pg (ref 26.0–34.0)
MCHC: 33.4 g/dL (ref 32.0–36.0)
MCV: 89.6 fL (ref 80.0–100.0)
MONO ABS: 0.8 10*3/uL (ref 0.2–0.9)
MONOS PCT: 13 %
Neutro Abs: 4.3 10*3/uL (ref 1.4–6.5)
Neutrophils Relative %: 68 %
Platelets: 157 10*3/uL (ref 150–440)
RBC: 3.25 MIL/uL — ABNORMAL LOW (ref 3.80–5.20)
RDW: 14.7 % — AB (ref 11.5–14.5)
WBC: 6.4 10*3/uL (ref 3.6–11.0)

## 2015-07-06 LAB — VITAMIN B12: VITAMIN B 12: 369 pg/mL (ref 180–914)

## 2015-07-07 LAB — VITAMIN D 25 HYDROXY (VIT D DEFICIENCY, FRACTURES): VIT D 25 HYDROXY: 25.8 ng/mL — AB (ref 30.0–100.0)

## 2015-07-11 ENCOUNTER — Encounter
Admission: RE | Admit: 2015-07-11 | Discharge: 2015-07-11 | Disposition: A | Discharge status: Home / Self Care | Payer: Medicare Other | Source: Ambulatory Visit | Attending: Internal Medicine | Admitting: Internal Medicine

## 2015-07-27 LAB — URINALYSIS COMPLETE WITH MICROSCOPIC (ARMC ONLY)
BACTERIA UA: NONE SEEN
Bilirubin Urine: NEGATIVE
Glucose, UA: NEGATIVE mg/dL
Hgb urine dipstick: NEGATIVE
Ketones, ur: NEGATIVE mg/dL
Leukocytes, UA: NEGATIVE
Nitrite: NEGATIVE
PH: 5 (ref 5.0–8.0)
PROTEIN: 100 mg/dL — AB
Specific Gravity, Urine: 1.017 (ref 1.005–1.030)

## 2015-07-28 LAB — URINE CULTURE

## 2015-07-29 LAB — URINALYSIS COMPLETE WITH MICROSCOPIC (ARMC ONLY)
BILIRUBIN URINE: NEGATIVE
Bacteria, UA: NONE SEEN
GLUCOSE, UA: NEGATIVE mg/dL
HGB URINE DIPSTICK: NEGATIVE
KETONES UR: NEGATIVE mg/dL
LEUKOCYTES UA: NEGATIVE
NITRITE: NEGATIVE
Protein, ur: 100 mg/dL — AB
SPECIFIC GRAVITY, URINE: 1.021 (ref 1.005–1.030)
pH: 5 (ref 5.0–8.0)

## 2015-07-30 LAB — CBC WITH DIFFERENTIAL/PLATELET
BASOS PCT: 0 %
Basophils Absolute: 0 10*3/uL (ref 0–0.1)
EOS ABS: 0.1 10*3/uL (ref 0–0.7)
Eosinophils Relative: 1 %
HCT: 29.6 % — ABNORMAL LOW (ref 35.0–47.0)
Hemoglobin: 9.7 g/dL — ABNORMAL LOW (ref 12.0–16.0)
Lymphocytes Relative: 7 %
Lymphs Abs: 0.6 10*3/uL — ABNORMAL LOW (ref 1.0–3.6)
MCH: 29.3 pg (ref 26.0–34.0)
MCHC: 32.8 g/dL (ref 32.0–36.0)
MCV: 89.4 fL (ref 80.0–100.0)
MONO ABS: 0.8 10*3/uL (ref 0.2–0.9)
MONOS PCT: 10 %
NEUTROS PCT: 82 %
Neutro Abs: 6.6 10*3/uL — ABNORMAL HIGH (ref 1.4–6.5)
Platelets: 228 10*3/uL (ref 150–440)
RBC: 3.31 MIL/uL — ABNORMAL LOW (ref 3.80–5.20)
RDW: 15.5 % — AB (ref 11.5–14.5)
WBC: 8.1 10*3/uL (ref 3.6–11.0)

## 2015-07-30 LAB — BASIC METABOLIC PANEL
Anion gap: 7 (ref 5–15)
BUN: 52 mg/dL — ABNORMAL HIGH (ref 6–20)
CALCIUM: 8.9 mg/dL (ref 8.9–10.3)
CO2: 25 mmol/L (ref 22–32)
CREATININE: 1.56 mg/dL — AB (ref 0.44–1.00)
Chloride: 106 mmol/L (ref 101–111)
GFR, EST AFRICAN AMERICAN: 32 mL/min — AB (ref >60)
GFR, EST NON AFRICAN AMERICAN: 28 mL/min — AB (ref >60)
GLUCOSE: 171 mg/dL — AB (ref 65–99)
Potassium: 4.1 mmol/L (ref 3.5–5.1)
Sodium: 138 mmol/L (ref 135–145)

## 2015-07-30 LAB — FOLATE: FOLATE: 38 ng/mL (ref >5.9)

## 2015-07-31 LAB — URINE CULTURE

## 2015-08-01 ENCOUNTER — Other Ambulatory Visit: Payer: Self-pay | Admitting: Gerontology

## 2015-08-01 DIAGNOSIS — R410 Disorientation, unspecified: Secondary | ICD-10-CM

## 2015-08-01 DIAGNOSIS — R4189 Other symptoms and signs involving cognitive functions and awareness: Secondary | ICD-10-CM

## 2015-08-01 LAB — URINALYSIS COMPLETE WITH MICROSCOPIC (ARMC ONLY)
Bilirubin Urine: NEGATIVE
Glucose, UA: NEGATIVE mg/dL
Hgb urine dipstick: NEGATIVE
Ketones, ur: NEGATIVE mg/dL
Leukocytes, UA: NEGATIVE
NITRITE: NEGATIVE
PROTEIN: 30 mg/dL — AB
SPECIFIC GRAVITY, URINE: 1.017 (ref 1.005–1.030)
pH: 6 (ref 5.0–8.0)

## 2015-08-03 LAB — CBC WITH DIFFERENTIAL/PLATELET
Basophils Absolute: 0 10*3/uL (ref 0–0.1)
Basophils Relative: 0 %
Eosinophils Absolute: 0.2 10*3/uL (ref 0–0.7)
Eosinophils Relative: 3 %
HEMATOCRIT: 24.8 % — AB (ref 35.0–47.0)
HEMOGLOBIN: 8.1 g/dL — AB (ref 12.0–16.0)
LYMPHS ABS: 0.9 10*3/uL — AB (ref 1.0–3.6)
LYMPHS PCT: 12 %
MCH: 28.5 pg (ref 26.0–34.0)
MCHC: 32.5 g/dL (ref 32.0–36.0)
MCV: 87.6 fL (ref 80.0–100.0)
MONO ABS: 1 10*3/uL — AB (ref 0.2–0.9)
MONOS PCT: 12 %
NEUTROS ABS: 5.7 10*3/uL (ref 1.4–6.5)
NEUTROS PCT: 73 %
Platelets: 213 10*3/uL (ref 150–440)
RBC: 2.83 MIL/uL — ABNORMAL LOW (ref 3.80–5.20)
RDW: 15.2 % — AB (ref 11.5–14.5)
WBC: 7.8 10*3/uL (ref 3.6–11.0)

## 2015-08-03 LAB — CBC
HCT: 23.9 % — ABNORMAL LOW (ref 35.0–47.0)
Hemoglobin: 7.8 g/dL — ABNORMAL LOW (ref 12.0–16.0)
MCH: 28.9 pg (ref 26.0–34.0)
MCHC: 32.5 g/dL (ref 32.0–36.0)
MCV: 89 fL (ref 80.0–100.0)
PLATELETS: 212 10*3/uL (ref 150–440)
RBC: 2.69 MIL/uL — ABNORMAL LOW (ref 3.80–5.20)
RDW: 15.2 % — ABNORMAL HIGH (ref 11.5–14.5)
WBC: 8.4 10*3/uL (ref 3.6–11.0)

## 2015-08-03 LAB — OCCULT BLOOD X 1 CARD TO LAB, STOOL: Fecal Occult Bld: POSITIVE — AB

## 2015-08-04 LAB — CBC WITH DIFFERENTIAL/PLATELET
BASOS ABS: 0 10*3/uL (ref 0–0.1)
BASOS PCT: 0 %
EOS ABS: 0.2 10*3/uL (ref 0–0.7)
Eosinophils Relative: 3 %
HCT: 23.8 % — ABNORMAL LOW (ref 35.0–47.0)
HEMOGLOBIN: 7.8 g/dL — AB (ref 12.0–16.0)
Lymphocytes Relative: 10 %
Lymphs Abs: 0.7 10*3/uL — ABNORMAL LOW (ref 1.0–3.6)
MCH: 28.8 pg (ref 26.0–34.0)
MCHC: 32.5 g/dL (ref 32.0–36.0)
MCV: 88.5 fL (ref 80.0–100.0)
MONOS PCT: 13 %
Monocytes Absolute: 1 10*3/uL — ABNORMAL HIGH (ref 0.2–0.9)
NEUTROS PCT: 74 %
Neutro Abs: 5.6 10*3/uL (ref 1.4–6.5)
Platelets: 218 10*3/uL (ref 150–440)
RBC: 2.69 MIL/uL — ABNORMAL LOW (ref 3.80–5.20)
RDW: 15.3 % — AB (ref 11.5–14.5)
WBC: 7.5 10*3/uL (ref 3.6–11.0)

## 2015-08-05 LAB — URINE CULTURE

## 2015-08-06 ENCOUNTER — Other Ambulatory Visit: Payer: Self-pay | Admitting: Gerontology

## 2015-08-06 ENCOUNTER — Ambulatory Visit
Admission: RE | Admit: 2015-08-06 | Discharge: 2015-08-06 | Disposition: A | Discharge status: Home / Self Care | Payer: Medicare Other | Source: Ambulatory Visit | Attending: Gerontology | Admitting: Gerontology

## 2015-08-06 DIAGNOSIS — I679 Cerebrovascular disease, unspecified: Secondary | ICD-10-CM | POA: Diagnosis not present

## 2015-08-06 DIAGNOSIS — R410 Disorientation, unspecified: Secondary | ICD-10-CM | POA: Insufficient documentation

## 2015-08-06 DIAGNOSIS — R4189 Other symptoms and signs involving cognitive functions and awareness: Secondary | ICD-10-CM | POA: Insufficient documentation

## 2015-08-06 DIAGNOSIS — G319 Degenerative disease of nervous system, unspecified: Secondary | ICD-10-CM | POA: Insufficient documentation

## 2015-08-06 DIAGNOSIS — Z8673 Personal history of transient ischemic attack (TIA), and cerebral infarction without residual deficits: Secondary | ICD-10-CM | POA: Diagnosis not present

## 2015-08-07 ENCOUNTER — Emergency Department
Admission: EM | Admit: 2015-08-07 | Discharge: 2015-08-07 | Disposition: A | Discharge status: Home / Self Care | Payer: Medicare Other | Attending: Emergency Medicine | Admitting: Emergency Medicine

## 2015-08-07 ENCOUNTER — Emergency Department: Payer: Medicare Other

## 2015-08-07 ENCOUNTER — Encounter: Payer: Self-pay | Admitting: *Deleted

## 2015-08-07 DIAGNOSIS — Y9301 Activity, walking, marching and hiking: Secondary | ICD-10-CM | POA: Insufficient documentation

## 2015-08-07 DIAGNOSIS — E782 Mixed hyperlipidemia: Secondary | ICD-10-CM | POA: Diagnosis not present

## 2015-08-07 DIAGNOSIS — S72009A Fracture of unspecified part of neck of unspecified femur, initial encounter for closed fracture: Secondary | ICD-10-CM | POA: Insufficient documentation

## 2015-08-07 DIAGNOSIS — IMO0002 Reserved for concepts with insufficient information to code with codable children: Secondary | ICD-10-CM

## 2015-08-07 DIAGNOSIS — Y92002 Bathroom of unspecified non-institutional (private) residence single-family (private) house as the place of occurrence of the external cause: Secondary | ICD-10-CM | POA: Diagnosis not present

## 2015-08-07 DIAGNOSIS — W010XXA Fall on same level from slipping, tripping and stumbling without subsequent striking against object, initial encounter: Secondary | ICD-10-CM | POA: Insufficient documentation

## 2015-08-07 DIAGNOSIS — W19XXXA Unspecified fall, initial encounter: Secondary | ICD-10-CM

## 2015-08-07 DIAGNOSIS — S0181XA Laceration without foreign body of other part of head, initial encounter: Secondary | ICD-10-CM | POA: Insufficient documentation

## 2015-08-07 DIAGNOSIS — Z8673 Personal history of transient ischemic attack (TIA), and cerebral infarction without residual deficits: Secondary | ICD-10-CM | POA: Insufficient documentation

## 2015-08-07 DIAGNOSIS — Y998 Other external cause status: Secondary | ICD-10-CM | POA: Diagnosis not present

## 2015-08-07 DIAGNOSIS — Z79899 Other long term (current) drug therapy: Secondary | ICD-10-CM | POA: Diagnosis not present

## 2015-08-07 MED ORDER — LIDOCAINE-EPINEPHRINE 1 %-1:100000 IJ SOLN
10.0000 mL | Freq: Once | INTRAMUSCULAR | Status: AC
  Administered 2015-08-07: 10 mL via INTRADERMAL

## 2015-08-07 NOTE — ED Notes (Signed)
Pt brought in via ems from [facility].  Pt fell onto the floor backwards striking head.  Pt has laceration to back of head.  Bleeding controlled.  No loc.  No vomiting.  Pt is on blood thinner.  Pt also has cast on left wrist and has fx left hip.  Pt has been at nursing home for 1 month since fall last time.  siderails up x 2.

## 2015-08-07 NOTE — Discharge Instructions (Signed)
Please have Cathy Harvey be seen in 7-10 days for staple removal. Please clean the area with wet gauze but no direct shower until 24 hours later. Please seek medical attention for any high fevers, chest pain, shortness of breath, change in behavior, persistent vomiting, bloody stool or any other new or concerning symptoms.  Laceration Care, Adult A laceration is a cut that goes through all of the layers of the skin and into the tissue that is right under the skin. Some lacerations heal on their own. Others need to be closed with stitches (sutures), staples, skin adhesive strips, or skin glue. Proper laceration care minimizes the risk of infection and helps the laceration to heal better. HOW TO CARE FOR YOUR LACERATION If sutures or staples were used:  Keep the wound clean and dry.  If you were given a bandage (dressing), you should change it at least one time per day or as told by your health care provider. You should also change it if it becomes wet or dirty.  Keep the wound completely dry for the first 24 hours or as told by your health care provider. After that time, you may shower or bathe. However, make sure that the wound is not soaked in water until after the sutures or staples have been removed.  Clean the wound one time each day or as told by your health care provider:  Wash the wound with soap and water.  Rinse the wound with water to remove all soap.  Pat the wound dry with a clean towel. Do not rub the wound.  After cleaning the wound, apply a thin layer of antibiotic ointmentas told by your health care provider. This will help to prevent infection and keep the dressing from sticking to the wound.  Have the sutures or staples removed as told by your health care provider. If skin adhesive strips were used:  Keep the wound clean and dry.  If you were given a bandage (dressing), you should change it at least one time per day or as told by your health care provider. You  should also change it if it becomes dirty or wet.  Do not get the skin adhesive strips wet. You may shower or bathe, but be careful to keep the wound dry.  If the wound gets wet, pat it dry with a clean towel. Do not rub the wound.  Skin adhesive strips fall off on their own. You may trim the strips as the wound heals. Do not remove skin adhesive strips that are still stuck to the wound. They will fall off in time. If skin glue was used:  Try to keep the wound dry, but you may briefly wet it in the shower or bath. Do not soak the wound in water, such as by swimming.  After you have showered or bathed, gently pat the wound dry with a clean towel. Do not rub the wound.  Do not do any activities that will make you sweat heavily until the skin glue has fallen off on its own.  Do not apply liquid, cream, or ointment medicine to the wound while the skin glue is in place. Using those may loosen the film before the wound has healed.  If you were given a bandage (dressing), you should change it at least one time per day or as told by your health care provider. You should also change it if it becomes dirty or wet.  If a dressing is placed over the wound, be careful  not to apply tape directly over the skin glue. Doing that may cause the glue to be pulled off before the wound has healed.  Do not pick at the glue. The skin glue usually remains in place for 5-10 days, then it falls off of the skin. General Instructions  Take over-the-counter and prescription medicines only as told by your health care provider.  If you were prescribed an antibiotic medicine or ointment, take or apply it as told by your doctor. Do not stop using it even if your condition improves.  To help prevent scarring, make sure to cover your wound with sunscreen whenever you are outside after stitches are removed, after adhesive strips are removed, or when glue remains in place and the wound is healed. Make sure to wear a sunscreen  of at least 30 SPF.  Do not scratch or pick at the wound.  Keep all follow-up visits as told by your health care provider. This is important.  Check your wound every day for signs of infection. Watch for:  Redness, swelling, or pain.  Fluid, blood, or pus.  Raise (elevate) the injured area above the level of your heart while you are sitting or lying down, if possible. SEEK MEDICAL CARE IF:  You received a tetanus shot and you have swelling, severe pain, redness, or bleeding at the injection site.  You have a fever.  A wound that was closed breaks open.  You notice a bad smell coming from your wound or your dressing.  You notice something coming out of the wound, such as wood or glass.  Your pain is not controlled with medicine.  You have increased redness, swelling, or pain at the site of your wound.  You have fluid, blood, or pus coming from your wound.  You notice a change in the color of your skin near your wound.  You need to change the dressing frequently due to fluid, blood, or pus draining from the wound.  You develop a new rash.  You develop numbness around the wound. SEEK IMMEDIATE MEDICAL CARE IF:  You develop severe swelling around the wound.  Your pain suddenly increases and is severe.  You develop painful lumps near the wound or on skin that is anywhere on your body.  You have a red streak going away from your wound.  The wound is on your hand or foot and you cannot properly move a finger or toe.  The wound is on your hand or foot and you notice that your fingers or toes look pale or bluish.   This information is not intended to replace advice given to you by your health care provider. Make sure you discuss any questions you have with your health care provider.   Document Released: 04/28/2005 Document Revised: 09/12/2014 Document Reviewed: 04/24/2014 Elsevier Interactive Patient Education [employer]

## 2015-08-07 NOTE — ED Notes (Signed)
Wound sutured by md  Tolerated well  D/cinst top family.  Pt alert.

## 2015-08-07 NOTE — ED Notes (Signed)
Pt brought in via ems from [facility].  Pt fell going to bathroom.  Pt was without walker.  No loc.  No vomiting.  Pt is on blood thinner.  Pt alert.

## 2015-08-07 NOTE — ED Notes (Signed)
Family in with pt 

## 2015-08-07 NOTE — ED Notes (Signed)
Patient transported to CT 

## 2015-08-07 NOTE — ED Provider Notes (Signed)
[facility] Emergency Department Provider Note   ____________________________________________  Time seen: ~1820  I have reviewed the triage vital signs and the nursing notes.   HISTORY  Chief Complaint Fall and Head Laceration   History limited by: Not Limited   HPI Cathy Harvey is a 82 y.o. female who presents to the emergency department via EMS today after a fall. The patient states she was going up to go to the bathroom. She was not using her walker. She thinks she slipped her foot against the carpet. She then fell and hit her head. She did suffer laceration to her back or head. She states she has had a little bit of pain since the fall. It is located in the back of her head. There is mild. It has been constant. She denies any other pain. She denies blacking out. She denies any chest pain or palpitations. Denies any shortness of breath.   Past Medical History  Diagnosis Date  . Stroke (HCC)   . Hypertension   . Hypercholesteremia     Patient Active Problem List   Diagnosis Date Noted  . Hip fracture (HCC) 07/01/2015    Past Surgical History  Procedure Laterality Date  . Abdominal hysterectomy    . Hip surgery    . Open reduction internal fixation (orif) distal radial fracture Left 07/02/2015    Procedure: CLOSED REDUCTION LEFT DISTAL RADIAL FRACTURE WITH PINNING;  Surgeon: Deeann Saint, MD;  Location: [facility] ORS;  Service: Orthopedics;  Laterality: Left;    Current Outpatient Rx  Name  Route  Sig  Dispense  Refill  . aspirin 325 MG EC tablet   Oral   Take 1 tablet (325 mg total) by mouth 2 (two) times daily.   60 tablet   0     For 6 weeks and then change to 81 mg po daily (per ...   . cephALEXin (KEFLEX) 500 MG capsule   Oral   Take 1 capsule (500 mg total) by mouth every 12 (twelve) hours.   10 capsule   0   . cilostazol (PLETAL) 100 MG tablet   Oral   Take 100 mg by mouth 2 (two) times daily.         . dorzolamide  (TRUSOPT) 2 % ophthalmic solution   Both Eyes   Place 1 drop into both eyes 2 (two) times daily.         Marland Kitchen ezetimibe (ZETIA) 10 MG tablet   Oral   Take 10 mg by mouth daily.         . Ferrous Sulfate (FEROSUL PO)   Oral   Take 1 tablet by mouth daily.         . ferrous sulfate 325 (65 FE) MG tablet   Oral   Take 325 mg by mouth daily with breakfast.         . HYDROcodone-acetaminophen (NORCO/VICODIN) 5-325 MG tablet   Oral   Take 1 tablet by mouth every 4 (four) hours as needed for severe pain.   30 tablet   0   . lisinopril (PRINIVIL,ZESTRIL) 20 MG tablet   Oral   Take 20 mg by mouth daily.         . metoprolol succinate (TOPROL-XL) 25 MG 24 hr tablet   Oral   Take 25 mg by mouth daily.         . Multiple Vitamins-Minerals (CENTRUM SILVER PO)   Oral   Take 1 tablet by mouth daily.  Allergies Codeine  Family History  Problem Relation Age of Onset  . Cancer Other     Social History Social History  Substance Use Topics  . Smoking status: Never Smoker   . Smokeless tobacco: None  . Alcohol Use: No    Review of Systems  Constitutional: Negative for fever. Cardiovascular: Negative for chest pain. Respiratory: Negative for shortness of breath. Gastrointestinal: Negative for abdominal pain, vomiting and diarrhea. Musculoskeletal: Negative for back pain. Skin: Negative for rash. Positive for cut to back of head. Neurological: Negative for headaches, focal weakness or numbness.  10-point ROS otherwise negative.  ____________________________________________   PHYSICAL EXAM:  VITAL SIGNS: ED Triage Vitals  Enc Vitals Group     BP 08/07/15 1712 133/47 mmHg     Pulse Rate 08/07/15 1712 93     Resp 08/07/15 1712 20     Temp 08/07/15 1712 97.6 F (36.4 C)     Temp Source 08/07/15 1712 Oral     SpO2 08/07/15 1712 99 %     Weight 08/07/15 1712 120 lb (54.432 kg)     Height 08/07/15 1712 5\' 2"  (1.575 m)     Head Cir --      Peak  Flow --      Pain Score 08/07/15 1713 5   Constitutional: Alert and oriented. Well appearing and in no distress. Eyes: Conjunctivae are normal. PERRL. Normal extraocular movements. ENT   Head: Normocephalic. Roughly 2 cm linear laceration to occiput. No hemotympanum.   Nose: No congestion/rhinnorhea.   Mouth/Throat: Mucous membranes are moist.   Neck: No stridor. No midline tenderness.  Hematological/Lymphatic/Immunilogical: No cervical lymphadenopathy. Cardiovascular: Normal rate, regular rhythm.  No murmurs, rubs, or gallops. Respiratory: Normal respiratory effort without tachypnea nor retractions. Breath sounds are clear and equal bilaterally. No wheezes/rales/rhonchi. Gastrointestinal: Soft and nontender. No distention. There is no CVA tenderness. Genitourinary: Deferred Musculoskeletal: Normal range of motion in all extremities. No joint effusions.  No lower extremity tenderness nor edema. Neurologic:  Normal speech and language. No gross focal neurologic deficits are appreciated.  Skin:  Skin is warm, dry and intact. No rash noted. Psychiatric: Mood and affect are normal. Speech and behavior are normal. Patient exhibits appropriate insight and judgment.  ____________________________________________    LABS (pertinent positives/negatives)  None  ____________________________________________   EKG  None  ____________________________________________    RADIOLOGY  CT head/cervical spine IMPRESSION: 1. No evidence of intracranial or cervical spine injury. 2. Chronic small vessel disease with remote right lateral lenticulostriate infarct. 3. Right sphenoid sinusitis and left otomastoiditis.  ____________________________________________   PROCEDURES  Procedure(s) performed: laceration repair, see procedure note(s).  Critical Care performed: No  LACERATION REPAIR Performed by: Phineas SemenGOODMAN, Danetra Glock Authorized by: Phineas SemenGOODMAN, Baltazar Pekala Consent: Verbal consent  obtained. Risks and benefits: risks, benefits and alternatives were discussed Consent given by: patient Patient identity confirmed: provided demographic data Prepped and Draped in normal sterile fashion Wound explored  Laceration Location: Occiput  Laceration Length: 2 cm  No Foreign Bodies seen or palpated  Anesthesia: local infiltration  Local anesthetic: lidocaine 1% with epinephrine  Anesthetic total: 2 ml  Irrigation method: syringe Amount of cleaning: standard  Skin closure: staple  Number of staples: 3  Patient tolerance: Patient tolerated the procedure well with no immediate complications.  ____________________________________________   INITIAL IMPRESSION / ASSESSMENT AND PLAN / ED COURSE  Pertinent labs & imaging results that were available during my care of the patient were reviewed by me and considered in my medical decision making (see  chart for details).  Patient presents after slip and fall with laceration to occiput. CT scan negative. Laceration closed with staples.  ____________________________________________   FINAL CLINICAL IMPRESSION(S) / ED DIAGNOSES  Final diagnoses:  Fall, initial encounter  Laceration     Phineas Semen, MD 08/07/15 [phone]

## 2015-08-07 NOTE — ED Notes (Signed)
MD at bedside. 

## 2015-08-07 NOTE — ED Notes (Signed)
Family with pt.  Pt alert.   

## 2015-08-11 ENCOUNTER — Encounter
Admission: RE | Admit: 2015-08-11 | Discharge: 2015-08-11 | Disposition: A | Discharge status: Home / Self Care | Payer: Medicare Other | Source: Ambulatory Visit | Attending: Internal Medicine | Admitting: Internal Medicine

## 2015-08-13 LAB — COMPREHENSIVE METABOLIC PANEL
ALT: 19 U/L (ref 14–54)
AST: 31 U/L (ref 15–41)
Albumin: 2.8 g/dL — ABNORMAL LOW (ref 3.5–5.0)
Alkaline Phosphatase: 71 U/L (ref 38–126)
Anion gap: 5 (ref 5–15)
BUN: 69 mg/dL — ABNORMAL HIGH (ref 6–20)
CHLORIDE: 111 mmol/L (ref 101–111)
CO2: 19 mmol/L — AB (ref 22–32)
Calcium: 8.9 mg/dL (ref 8.9–10.3)
Creatinine, Ser: 3.02 mg/dL — ABNORMAL HIGH (ref 0.44–1.00)
GFR calc Af Amer: 15 mL/min — ABNORMAL LOW (ref >60)
GFR, EST NON AFRICAN AMERICAN: 13 mL/min — AB (ref >60)
Glucose, Bld: 97 mg/dL (ref 65–99)
POTASSIUM: 4.9 mmol/L (ref 3.5–5.1)
SODIUM: 135 mmol/L (ref 135–145)
Total Bilirubin: 0.5 mg/dL (ref 0.3–1.2)
Total Protein: 5.5 g/dL — ABNORMAL LOW (ref 6.5–8.1)

## 2015-08-13 LAB — CBC WITH DIFFERENTIAL/PLATELET
BASOS ABS: 0 10*3/uL (ref 0–0.1)
BASOS PCT: 1 %
EOS ABS: 0.1 10*3/uL (ref 0–0.7)
EOS PCT: 3 %
HCT: 17.7 % — ABNORMAL LOW (ref 35.0–47.0)
Hemoglobin: 5.9 g/dL — ABNORMAL LOW (ref 12.0–16.0)
LYMPHS PCT: 17 %
Lymphs Abs: 0.8 10*3/uL — ABNORMAL LOW (ref 1.0–3.6)
MCH: 29 pg (ref 26.0–34.0)
MCHC: 33.2 g/dL (ref 32.0–36.0)
MCV: 87.3 fL (ref 80.0–100.0)
MONO ABS: 0.6 10*3/uL (ref 0.2–0.9)
Monocytes Relative: 13 %
Neutro Abs: 3.2 10*3/uL (ref 1.4–6.5)
Neutrophils Relative %: 66 %
PLATELETS: 189 10*3/uL (ref 150–440)
RBC: 2.03 MIL/uL — AB (ref 3.80–5.20)
RDW: 16.3 % — AB (ref 11.5–14.5)
WBC: 4.8 10*3/uL (ref 3.6–11.0)

## 2015-08-14 LAB — CBC WITH DIFFERENTIAL/PLATELET
BASOS ABS: 0 10*3/uL (ref 0–0.1)
Basophils Relative: 1 %
Eosinophils Absolute: 0.1 10*3/uL (ref 0–0.7)
Eosinophils Relative: 2 %
HEMATOCRIT: 19.5 % — AB (ref 35.0–47.0)
Hemoglobin: 6.2 g/dL — ABNORMAL LOW (ref 12.0–16.0)
LYMPHS ABS: 0.9 10*3/uL — AB (ref 1.0–3.6)
LYMPHS PCT: 14 %
MCH: 28.5 pg (ref 26.0–34.0)
MCHC: 31.7 g/dL — ABNORMAL LOW (ref 32.0–36.0)
MCV: 89.8 fL (ref 80.0–100.0)
Monocytes Absolute: 0.8 10*3/uL (ref 0.2–0.9)
Monocytes Relative: 12 %
NEUTROS ABS: 4.5 10*3/uL (ref 1.4–6.5)
Neutrophils Relative %: 71 %
PLATELETS: 198 10*3/uL (ref 150–440)
RBC: 2.17 MIL/uL — AB (ref 3.80–5.20)
RDW: 16.2 % — AB (ref 11.5–14.5)
WBC: 6.3 10*3/uL (ref 3.6–11.0)

## 2015-09-10 DEATH — deceased

## 2016-11-11 IMAGING — CR DG WRIST COMPLETE 3+V*L*
3 series · 3 of 3 positions shown · non-contrast
Comparison: 06/29/2015

CLINICAL DATA: Per family pt fell [REDACTED] [DATE], known left
wrist fx. Pt fell again today, now c/o left lateral hip pain.

EXAM:
LEFT WRIST - COMPLETE 3+ VIEW

[wrist pa]
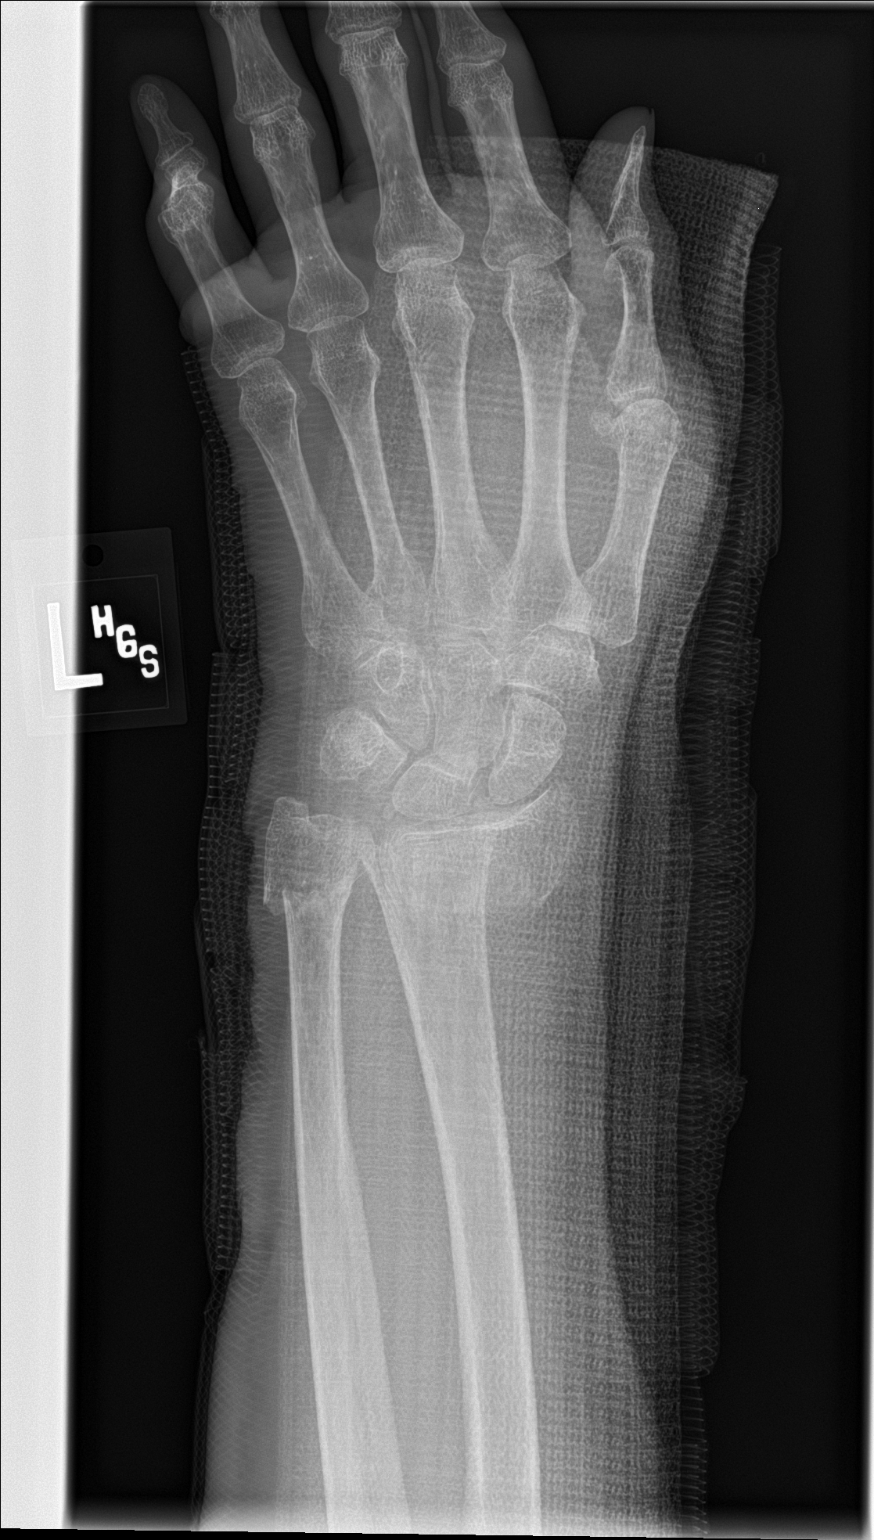

[wrist obl]
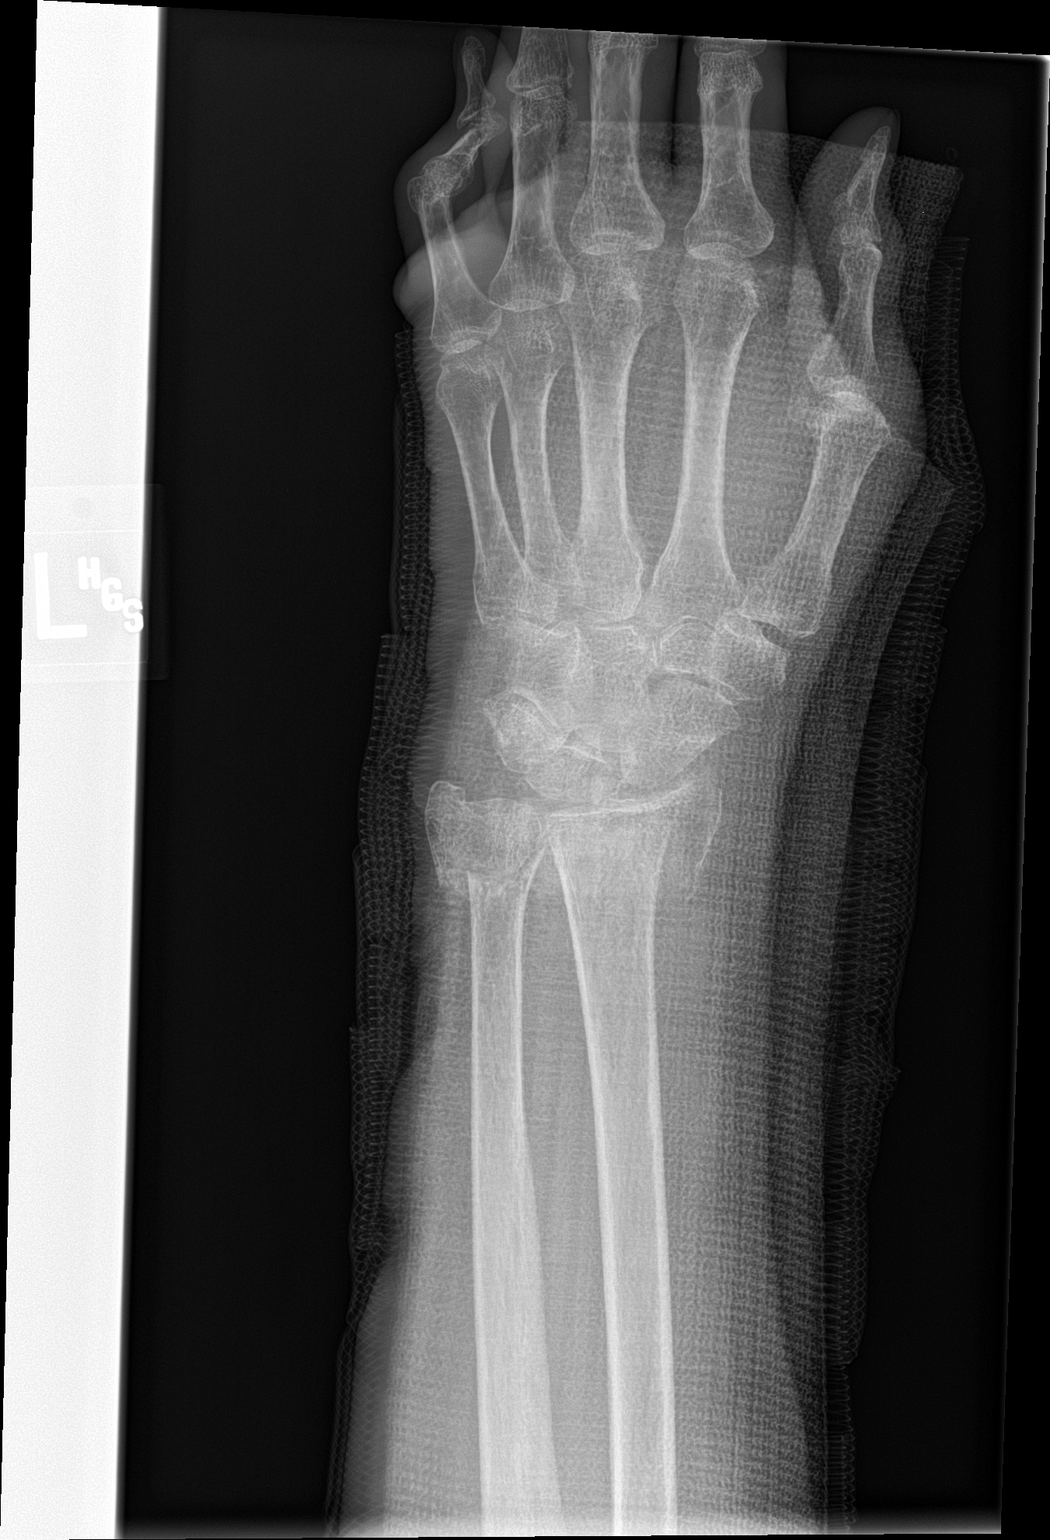

[wrist lat]
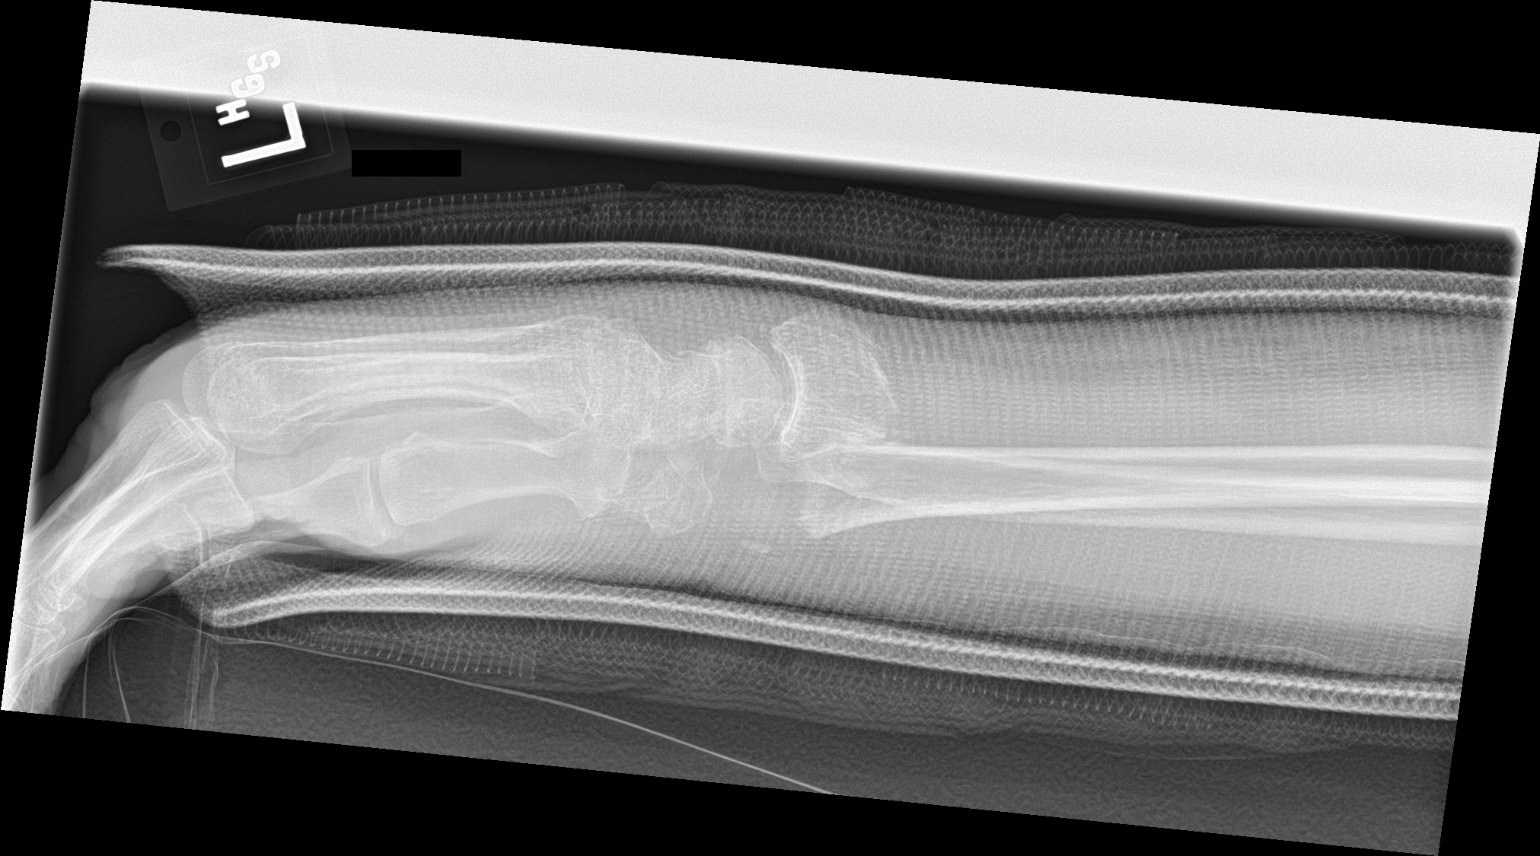

[3 of 3 positions shown; findings below may reference images not displayed]

FINDINGS: Wrist is imaged through plaster splint material. There are
transverse fractures of the distal radius and ulna associated with 1
shaft width posterior displacement and significant over riding of
fracture fragments. Alignment appears unchanged. There is limited
detail given the presence of plaster splinting material.
IMPRESSION: Unchanged alignment of distal radius and ulnar fractures.

## 2016-12-18 IMAGING — CT CT HEAD W/O CM
4 of 7 series · 15 of 47 positions shown, 17 images · non-contrast
Comparison: 08/06/2015 head CT

CLINICAL DATA: Fall with posterior head laceration. Initial
encounter.

EXAM:
CT HEAD WITHOUT CONTRAST
CT CERVICAL SPINE WITHOUT CONTRAST
TECHNIQUE: Multidetector CT imaging of the head and cervical spine was
performed following the standard protocol without intravenous
contrast. Multiplanar CT image reconstructions of the cervical spine
were also generated.

[Series 7: soft tissue · axial · 0.32mm/px · z∈[+93,+225]mm · 7 of 89 slices shown, 9 images]
[im 12/89  brain]
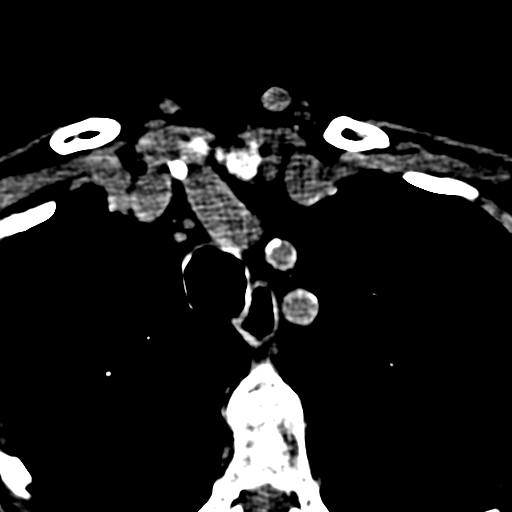
[im 12/89  bone]
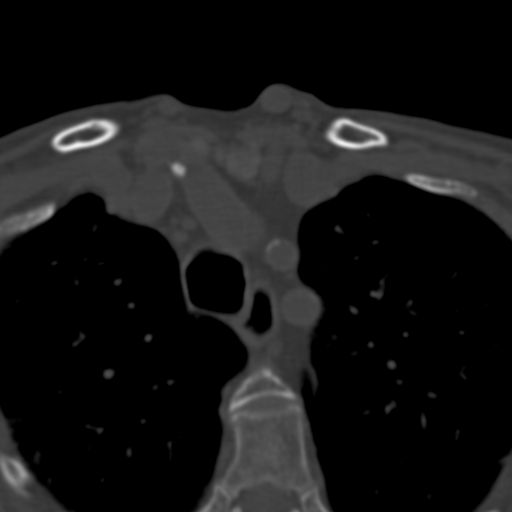
[im 23/89  brain]
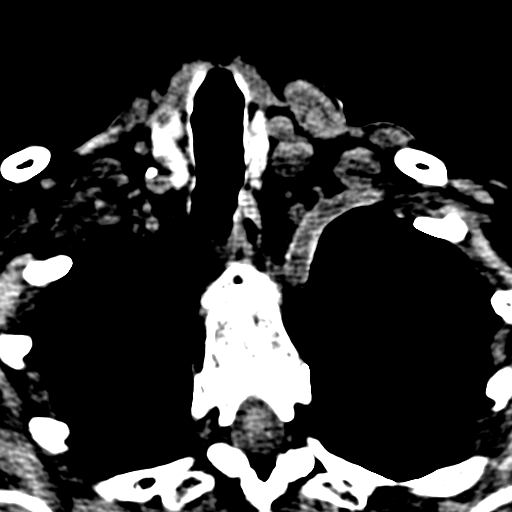
[im 34/89  brain]
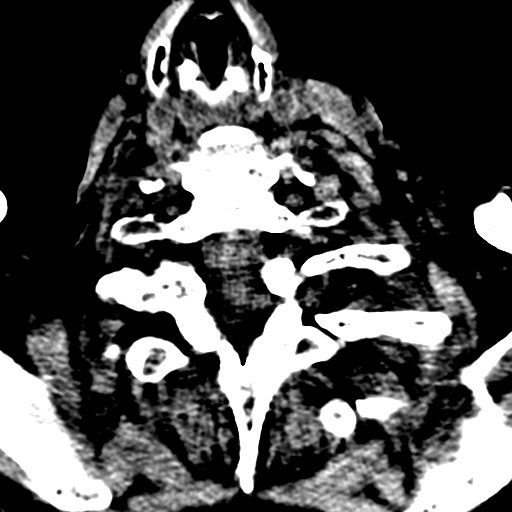
[im 45/89  brain]
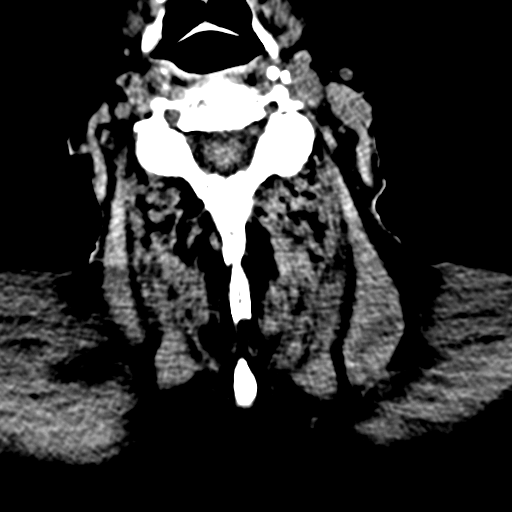
[im 56/89  brain]
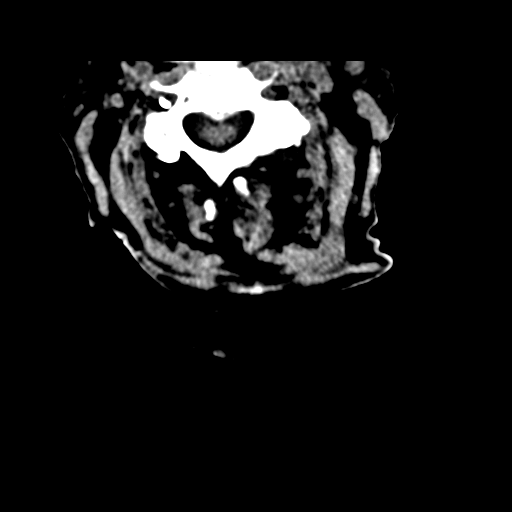
[im 56/89  bone]
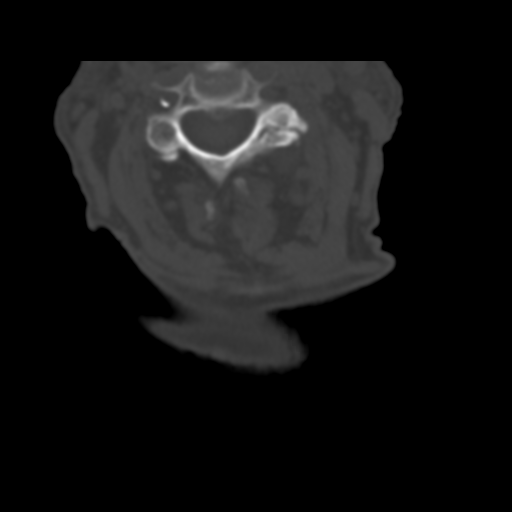
[im 67/89  brain]
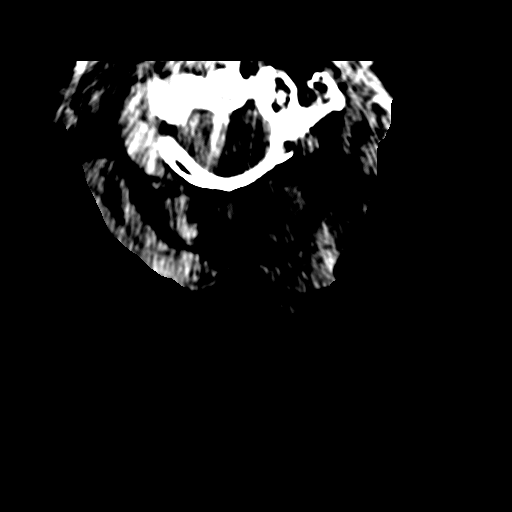
[im 78/89  brain]
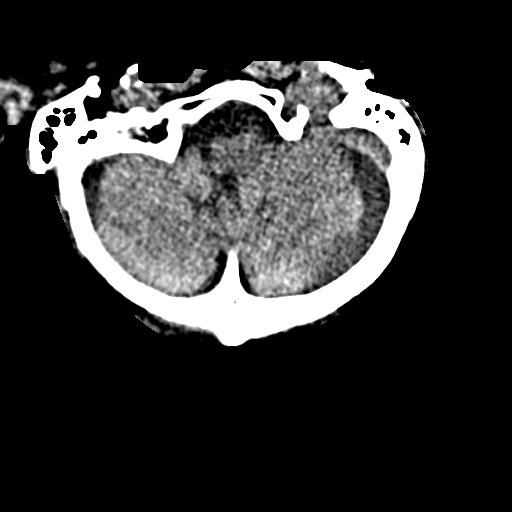

[Series 8: sagittal bone · sagittal · 0.31mm/px · 3 of 44 slices shown]
[im 15/44  brain]
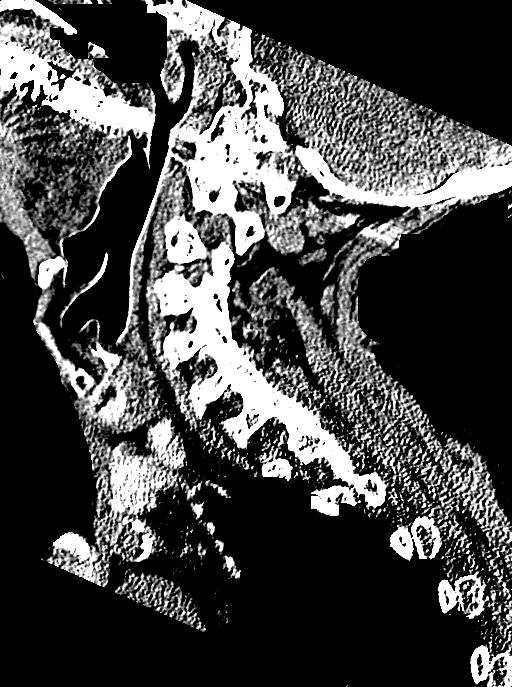
[im 22/44  brain]
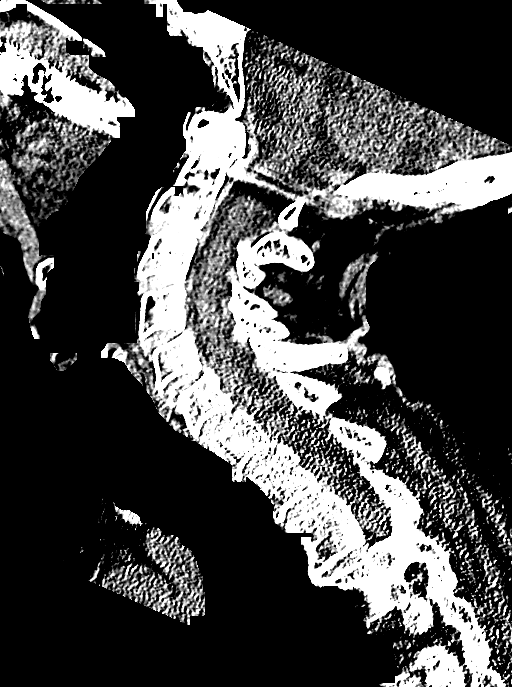
[im 29/44  brain]
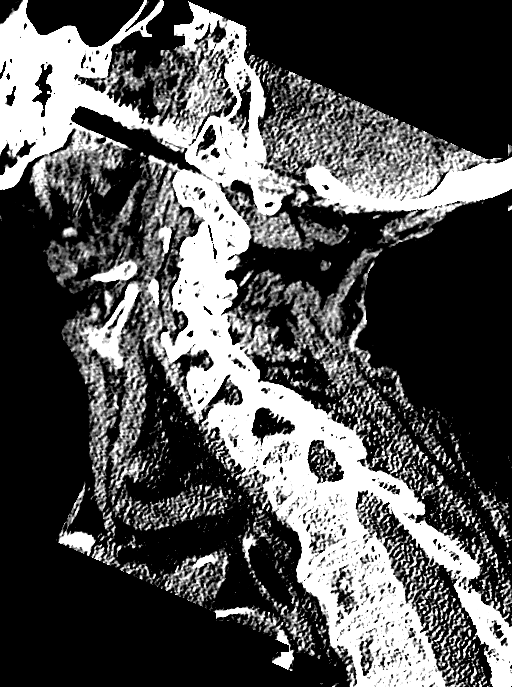

[Series 9: coronal bone · coronal · 0.28mm/px · 3 of 61 slices shown]
[im 21/61  brain]
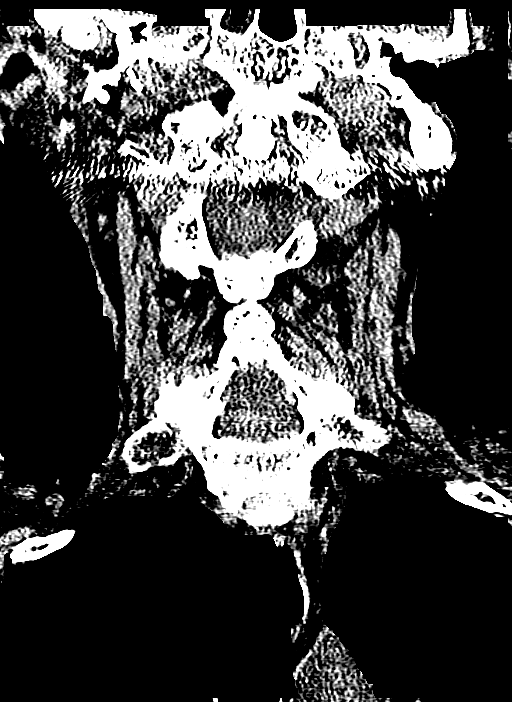
[im 27/61  brain]
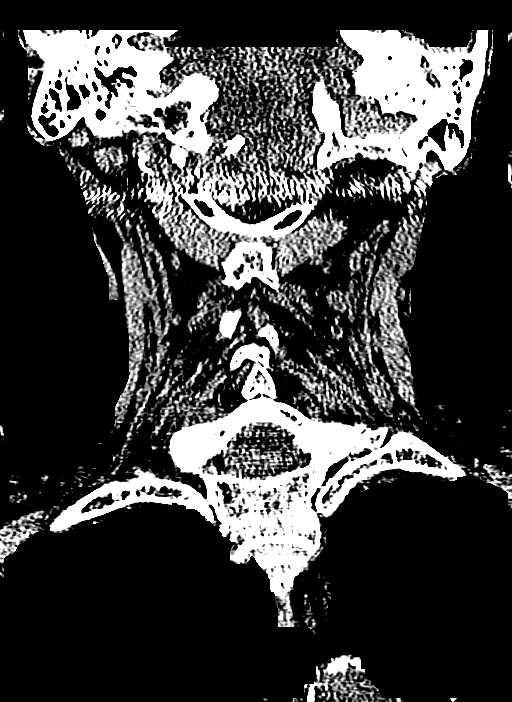
[im 34/61  brain]
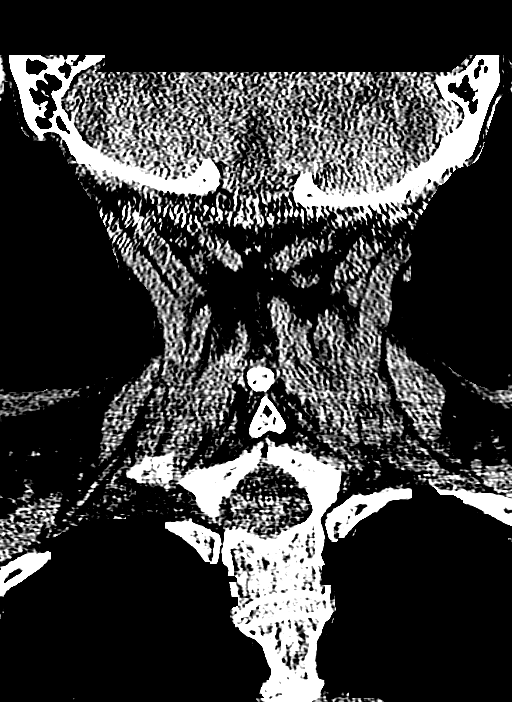

[Series 10: axial · axial · 0.27mm/px · z∈[+85,+106]mm · 2 of 86 slices shown]
[im 13/86  brain]
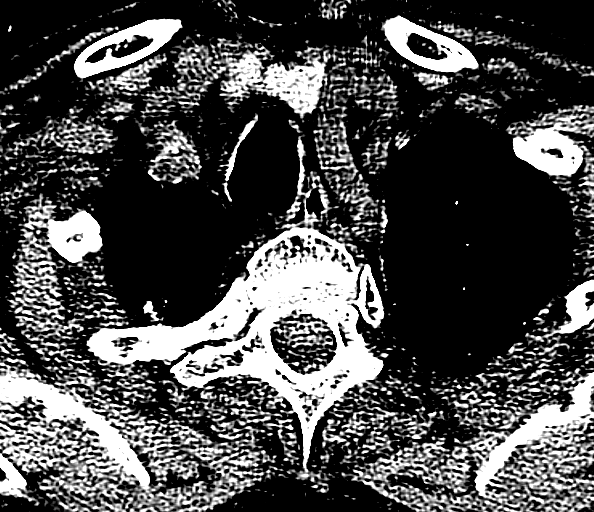
[im 25/86  brain]
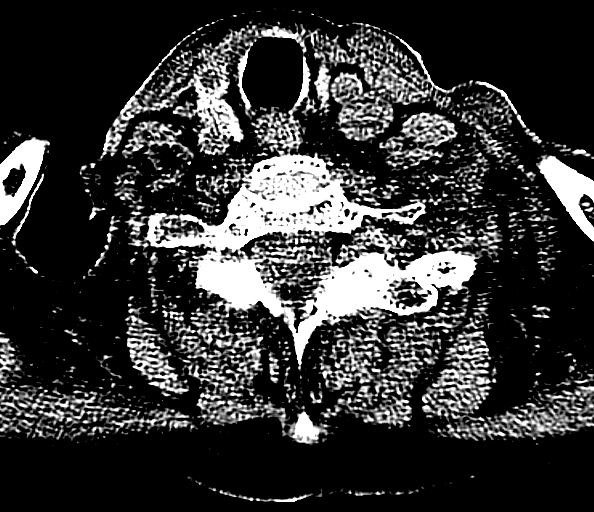

[15 of 47 positions shown; findings below may reference images not displayed]

FINDINGS: CT HEAD FINDINGS

Skull and Sinuses:Occipital scalp laceration and contusion. Negative
for fracture.

Focal right sphenoid sinusitis with frothy trapped secretions.

Left mastoid and middle ear opacification which is stable from
yesterday; no associated fracture. Negative left nasopharynx.

Visualized orbits: Metallic density at the bilateral anterior
chamber is likely for glaucoma treatment.

Brain: No evidence of acute infarction, hemorrhage, hydrocephalus,
or mass lesion/mass effect. Normal cerebral volume for age. There is
chronic small vessel disease with ischemic gliosis in the deep
cerebral white matter and a chronic appearing right lateral
lenticulostriate infarct.

CT CERVICAL SPINE FINDINGS

Negative for acute fracture or subluxation. No prevertebral edema.
No gross cervical canal hematoma.

Disc degeneration and facet arthropathy without signs of advanced
canal stenosis.
IMPRESSION: 1. No evidence of intracranial or cervical spine injury.
2. Chronic small vessel disease with remote right lateral
lenticulostriate infarct.
3. Right sphenoid sinusitis and left otomastoiditis.
